# Patient Record
Sex: Female | Born: 1965
Health system: Southern US, Community
[De-identification: ages and names within clinical notes are randomized; demographics above are authoritative.]

## PROBLEM LIST (undated history)

## (undated) DIAGNOSIS — S62102D Fracture of unspecified carpal bone, left wrist, subsequent encounter for fracture with routine healing: Secondary | ICD-10-CM

## (undated) DIAGNOSIS — B009 Herpesviral infection, unspecified: Secondary | ICD-10-CM

## (undated) DIAGNOSIS — E559 Vitamin D deficiency, unspecified: Secondary | ICD-10-CM

## (undated) DIAGNOSIS — F32A Depression, unspecified: Secondary | ICD-10-CM

## (undated) DIAGNOSIS — R112 Nausea with vomiting, unspecified: Secondary | ICD-10-CM

## (undated) DIAGNOSIS — F329 Major depressive disorder, single episode, unspecified: Secondary | ICD-10-CM

## (undated) DIAGNOSIS — Z9889 Other specified postprocedural states: Secondary | ICD-10-CM

## (undated) HISTORY — DX: Herpesviral infection, unspecified: B00.9

## (undated) HISTORY — PX: BREAST BIOPSY: SHX20

## (undated) HISTORY — DX: Major depressive disorder, single episode, unspecified: F32.9

## (undated) HISTORY — DX: Fracture of unspecified carpal bone, left wrist, subsequent encounter for fracture with routine healing: S62.102D

## (undated) HISTORY — DX: Vitamin D deficiency, unspecified: E55.9

## (undated) HISTORY — PX: NOVASURE ABLATION: SHX5394

## (undated) HISTORY — DX: Depression, unspecified: F32.A

## (undated) HISTORY — PX: TONSILLECTOMY AND ADENOIDECTOMY: SHX28

---

## 2008-02-08 ENCOUNTER — Other Ambulatory Visit: Admission: RE | Admit: 2008-02-08 | Discharge: 2008-02-08 | Payer: Self-pay | Admitting: Family Medicine

## 2008-02-09 ENCOUNTER — Ambulatory Visit (HOSPITAL_COMMUNITY): Admission: RE | Admit: 2008-02-09 | Discharge: 2008-02-09 | Payer: Self-pay | Admitting: Internal Medicine

## 2009-02-28 ENCOUNTER — Other Ambulatory Visit: Admission: RE | Admit: 2009-02-28 | Discharge: 2009-02-28 | Payer: Self-pay | Admitting: Internal Medicine

## 2010-05-16 ENCOUNTER — Other Ambulatory Visit: Admission: RE | Admit: 2010-05-16 | Discharge: 2010-05-16 | Payer: Self-pay | Admitting: Internal Medicine

## 2010-06-04 ENCOUNTER — Ambulatory Visit (HOSPITAL_COMMUNITY): Admission: RE | Admit: 2010-06-04 | Discharge: 2010-06-04 | Payer: Self-pay | Admitting: Internal Medicine

## 2011-05-04 ENCOUNTER — Other Ambulatory Visit (HOSPITAL_COMMUNITY): Payer: Self-pay | Admitting: Internal Medicine

## 2011-05-04 DIAGNOSIS — Z1231 Encounter for screening mammogram for malignant neoplasm of breast: Secondary | ICD-10-CM

## 2011-06-08 ENCOUNTER — Ambulatory Visit (HOSPITAL_COMMUNITY)
Admission: RE | Admit: 2011-06-08 | Discharge: 2011-06-08 | Disposition: A | Discharge status: Home / Self Care | Payer: 59 | Source: Ambulatory Visit | Attending: Internal Medicine | Admitting: Internal Medicine

## 2011-06-08 DIAGNOSIS — Z1231 Encounter for screening mammogram for malignant neoplasm of breast: Secondary | ICD-10-CM

## 2012-05-19 ENCOUNTER — Other Ambulatory Visit (HOSPITAL_COMMUNITY): Payer: Self-pay | Admitting: Internal Medicine

## 2012-05-19 DIAGNOSIS — Z1231 Encounter for screening mammogram for malignant neoplasm of breast: Secondary | ICD-10-CM

## 2012-06-10 ENCOUNTER — Ambulatory Visit (HOSPITAL_COMMUNITY)
Admission: RE | Admit: 2012-06-10 | Discharge: 2012-06-10 | Disposition: A | Discharge status: Home / Self Care | Payer: PRIVATE HEALTH INSURANCE | Source: Ambulatory Visit | Attending: Internal Medicine | Admitting: Internal Medicine

## 2012-06-10 DIAGNOSIS — Z1231 Encounter for screening mammogram for malignant neoplasm of breast: Secondary | ICD-10-CM | POA: Insufficient documentation

## 2012-12-28 HISTORY — PX: ABDOMINAL HYSTERECTOMY: SHX81

## 2012-12-28 HISTORY — PX: TOTAL ABDOMINAL HYSTERECTOMY: SHX209

## 2013-05-08 ENCOUNTER — Other Ambulatory Visit (HOSPITAL_COMMUNITY): Payer: Self-pay | Admitting: Internal Medicine

## 2013-05-08 DIAGNOSIS — Z1231 Encounter for screening mammogram for malignant neoplasm of breast: Secondary | ICD-10-CM

## 2013-05-24 ENCOUNTER — Other Ambulatory Visit (HOSPITAL_COMMUNITY): Payer: Self-pay | Admitting: Internal Medicine

## 2013-05-24 ENCOUNTER — Other Ambulatory Visit (HOSPITAL_COMMUNITY)
Admission: RE | Admit: 2013-05-24 | Discharge: 2013-05-24 | Disposition: A | Discharge status: Home / Self Care | Payer: PRIVATE HEALTH INSURANCE | Source: Ambulatory Visit | Attending: Internal Medicine | Admitting: Internal Medicine

## 2013-05-24 DIAGNOSIS — R52 Pain, unspecified: Secondary | ICD-10-CM

## 2013-05-24 DIAGNOSIS — Z01419 Encounter for gynecological examination (general) (routine) without abnormal findings: Secondary | ICD-10-CM | POA: Insufficient documentation

## 2013-06-06 ENCOUNTER — Ambulatory Visit (HOSPITAL_COMMUNITY)
Admission: RE | Admit: 2013-06-06 | Discharge: 2013-06-06 | Disposition: A | Discharge status: Home / Self Care | Payer: PRIVATE HEALTH INSURANCE | Source: Ambulatory Visit | Attending: Internal Medicine | Admitting: Internal Medicine

## 2013-06-06 ENCOUNTER — Other Ambulatory Visit (HOSPITAL_COMMUNITY): Payer: Self-pay | Admitting: Internal Medicine

## 2013-06-06 DIAGNOSIS — N852 Hypertrophy of uterus: Secondary | ICD-10-CM | POA: Insufficient documentation

## 2013-06-06 DIAGNOSIS — D259 Leiomyoma of uterus, unspecified: Secondary | ICD-10-CM | POA: Insufficient documentation

## 2013-06-06 DIAGNOSIS — R52 Pain, unspecified: Secondary | ICD-10-CM

## 2013-06-06 DIAGNOSIS — N949 Unspecified condition associated with female genital organs and menstrual cycle: Secondary | ICD-10-CM | POA: Insufficient documentation

## 2013-06-12 ENCOUNTER — Ambulatory Visit (HOSPITAL_COMMUNITY)
Admission: RE | Admit: 2013-06-12 | Discharge: 2013-06-12 | Disposition: A | Discharge status: Home / Self Care | Payer: PRIVATE HEALTH INSURANCE | Source: Ambulatory Visit | Attending: Internal Medicine | Admitting: Internal Medicine

## 2013-06-12 ENCOUNTER — Ambulatory Visit (HOSPITAL_COMMUNITY): Payer: 59

## 2013-06-12 DIAGNOSIS — Z1231 Encounter for screening mammogram for malignant neoplasm of breast: Secondary | ICD-10-CM

## 2013-07-19 ENCOUNTER — Other Ambulatory Visit: Payer: Self-pay | Admitting: Obstetrics & Gynecology

## 2013-11-08 ENCOUNTER — Other Ambulatory Visit: Payer: Self-pay | Admitting: *Deleted

## 2014-06-15 ENCOUNTER — Other Ambulatory Visit: Payer: Self-pay | Admitting: Physician Assistant

## 2014-06-15 ENCOUNTER — Encounter: Payer: Self-pay | Admitting: Physician Assistant

## 2014-06-15 ENCOUNTER — Ambulatory Visit (INDEPENDENT_AMBULATORY_CARE_PROVIDER_SITE_OTHER): Payer: PRIVATE HEALTH INSURANCE | Admitting: Physician Assistant

## 2014-06-15 VITALS — BP 108/74 | HR 64 | Temp 98.6°F | Resp 16 | Ht 64.0 in | Wt 144.4 lb

## 2014-06-15 DIAGNOSIS — Z0000 Encounter for general adult medical examination without abnormal findings: Secondary | ICD-10-CM

## 2014-06-15 DIAGNOSIS — Z1159 Encounter for screening for other viral diseases: Secondary | ICD-10-CM

## 2014-06-15 DIAGNOSIS — E559 Vitamin D deficiency, unspecified: Secondary | ICD-10-CM

## 2014-06-15 DIAGNOSIS — Z79899 Other long term (current) drug therapy: Secondary | ICD-10-CM

## 2014-06-15 DIAGNOSIS — Z118 Encounter for screening for other infectious and parasitic diseases: Secondary | ICD-10-CM

## 2014-06-15 DIAGNOSIS — Z113 Encounter for screening for infections with a predominantly sexual mode of transmission: Secondary | ICD-10-CM

## 2014-06-15 LAB — CBC WITH DIFFERENTIAL/PLATELET
BASOS ABS: 0.1 10*3/uL (ref 0.0–0.1)
Basophils Relative: 1 % (ref 0–1)
EOS ABS: 0.1 10*3/uL (ref 0.0–0.7)
Eosinophils Relative: 1 % (ref 0–5)
HEMATOCRIT: 43.5 % (ref 36.0–46.0)
Hemoglobin: 14.5 g/dL (ref 12.0–15.0)
LYMPHS ABS: 1.7 10*3/uL (ref 0.7–4.0)
LYMPHS PCT: 27 % (ref 12–46)
MCH: 29.6 pg (ref 26.0–34.0)
MCHC: 33.3 g/dL (ref 30.0–36.0)
MCV: 88.8 fL (ref 78.0–100.0)
MONOS PCT: 7 % (ref 3–12)
Monocytes Absolute: 0.4 10*3/uL (ref 0.1–1.0)
NEUTROS PCT: 64 % (ref 43–77)
Neutro Abs: 4 10*3/uL (ref 1.7–7.7)
Platelets: 290 10*3/uL (ref 150–400)
RBC: 4.9 MIL/uL (ref 3.87–5.11)
RDW: 13.7 % (ref 11.5–15.5)
WBC: 6.2 10*3/uL (ref 4.0–10.5)

## 2014-06-15 LAB — URINALYSIS, ROUTINE W REFLEX MICROSCOPIC
Bilirubin Urine: NEGATIVE
GLUCOSE, UA: NEGATIVE mg/dL
HGB URINE DIPSTICK: NEGATIVE
Ketones, ur: NEGATIVE mg/dL
Leukocytes, UA: NEGATIVE
NITRITE: NEGATIVE
PH: 7 (ref 5.0–8.0)
Protein, ur: NEGATIVE mg/dL
Specific Gravity, Urine: 1.017 (ref 1.005–1.030)
UROBILINOGEN UA: 0.2 mg/dL (ref 0.0–1.0)

## 2014-06-15 LAB — HEMOGLOBIN A1C
HEMOGLOBIN A1C: 5.1 % (ref <5.7)
Mean Plasma Glucose: 100 mg/dL (ref <117)

## 2014-06-15 NOTE — Progress Notes (Signed)
Complete Physical  Assessment and Plan: HSV-1 (herpes simplex virus 1) infection-remission  Depression-remission  Vitamin D deficiency-check level  STD screening per patient w/o signs of infection Suggest getting 3D MGM due to density of breast On estrogen therapy, low risk DVT but add bASA Counseled on tanning and sunscreen use Health Maintenance  Discussed med's effects and SE's. Screening labs and tests as requested with regular follow-up as recommended.  HPI 48 y.o. female  presents for a complete physical. Her blood pressure has been controlled at home, today their BP is BP: 108/74 mmHg She does workout. She denies chest pain, shortness of breath, dizziness.  She is not on cholesterol medication and denies myalgias. Her cholesterol is at goal. The cholesterol last visit was:  LDL 60 Last A1C in the office was: 5.2  Patient is on Vitamin D supplement, 65. Patient has dense breast, suggest 3D MGM. On estrogen, suggest getting on low dose aspirin at least 3 days a week.  Has custody of grand daughter, will be in kindergarten this year.  Husband has been drinking and partying so he was kicked out of the house for past 4 months.  She given blood regularly every 8-12 weeks.   Current Medications:       Esterified Estrogens 1.25 MG Tabs  Take 1.25 mg by mouth daily.       Health Maintenance:   Immunization History  Administered Date(s) Administered  . Influenza-Unspecified 10/18/2013  . Tdap 02/08/2008   Tetanus: 2009 Pneumovax: N/A Flu vaccine: 2014 Zostavax: N/A Pap: 04/2013  TAH 06/2013  MGM: 05/2013 DEXA: N/A Colonoscopy: N/A EGD: N/A DEE: glasses, yearly 02/2014  Allergies:  Allergies  Allergen Reactions  . Depo-Provera [Medroxyprogesterone]    Medical History:  Past Medical History  Diagnosis Date  . HSV-1 (herpes simplex virus 1) infection   . Depression   . Vitamin D deficiency    Surgical History:  Past Surgical History  Procedure Laterality  Date  . Tonsillectomy and adenoidectomy    . Novasure ablation    . Abdominal hysterectomy      Jori Moll Neal,MD. 7/14   Family History:  Family History  Problem Relation Age of Onset  . Leukemia Maternal Aunt   . Cancer Maternal Grandfather     breast   Social History:  History  Substance Use Topics  . Smoking status: Never Smoker   . Smokeless tobacco: Not on file  . Alcohol Use: No     Comment: occasional    Review of Systems: [X]  = complains of  [ ]  = denies  General: Fatigue [ ]  Fever [ ]  Chills [ ]  Weakness [ ]   Insomnia [ ] Weight change [ ]  Night sweats [ ]   Change in appetite [ ]  Eyes: Redness [ ]  Blurred vision [ ]  Diplopia [ ]  Discharge [ ]   ENT: Congestion [ ]  Sinus Pain [ ]  Post Nasal Drip [ ]  Sore Throat [ ]  Earache [ ]  hearing loss [ ]  Tinnitus [ ]  Snoring [ ]   Cardiac: Chest pain/pressure [ ]  SOB [ ]  Orthopnea [ ]   Palpitations [ ]   Paroxysmal nocturnal dyspnea[ ]  Claudication [ ]  Edema [ ]   Pulmonary: Cough [ ]  Wheezing[ ]   SOB [ ]   Pleurisy [ ]   GI: Nausea [ ]  Vomiting[ ]  Dysphagia[ ]  Heartburn[ ]  Abdominal pain [ ]  Constipation [ ] ; Diarrhea [ ]  BRBPR [ ]  Melena[ ]  Bloating [ ]  Hemorrhoids [ ]   GU: Hematuria[ ]  Dysuria [ ]  Nocturia[ ]  Urgency [ ]   Hesitancy [ ]  Discharge [ ]  Frequency [ ]   Breast:  Breast lumps [ ]   nipple discharge [ ]    Neuro: Headaches[ ]  Vertigo[ ]  Paresthesias[ ]  Spasm [ ]  Speech changes [ ]  Incoordination [ ]   Ortho: Arthritis [ ]  Joint pain [ ]  Muscle pain [ ]  Joint swelling [ ]  Back Pain [ ]  Skin:  Rash [ ]   Pruritis [ ]  Change in skin lesion [ ]   Psych: Depression[ ]  Anxiety[ ]  Confusion [ ]  Memory loss [ ]   Heme/Lypmh: Bleeding [ ]  Bruising [ ]  Enlarged lymph nodes [ ]   Endocrine: Visual blurring [ ]  Paresthesia [ ]  Polyuria [ ]  Polydypsea [ ]    Heat/cold intolerance [ ]  Hypoglycemia [ ]   Physical Exam: Estimated body mass index is 24.77 kg/(m^2) as calculated from the following:   Height as of this encounter: 5\' 4"  (1.626 m).    Weight as of this encounter: 144 lb 6.4 oz (65.499 kg). BP 108/74  Pulse 64  Temp(Src) 98.6 F (37 C)  Resp 16  Ht 5\' 4"  (1.626 m)  Wt 144 lb 6.4 oz (65.499 kg)  BMI 24.77 kg/m2  LMP 05/31/2013 General Appearance: Well nourished, in no apparent distress. Eyes: PERRLA, EOMs, conjunctiva no swelling or erythema, normal fundi and vessels. Sinuses: No Frontal/maxillary tenderness ENT/Mouth: Ext aud canals clear, normal light reflex with TMs without erythema, bulging.  Good dentition. No erythema, swelling, or exudate on post pharynx. Tonsils not swollen or erythematous. Hearing normal.  Neck: Supple, thyroid normal. No bruits Respiratory: Respiratory effort normal, BS equal bilaterally without rales, rhonchi, wheezing or stridor. Cardio: RRR without murmurs, rubs or gallops. Brisk peripheral pulses without edema.  Chest: symmetric, with normal excursions and percussion. Breasts: defer Abdomen: Soft, +BS. Non tender, no guarding, rebound, hernias, masses, or organomegaly. .  Lymphatics: Non tender without lymphadenopathy.  Genitourinary: defer Musculoskeletal: Full ROM all peripheral extremities,5/5 strength, and normal gait. Skin: Warm, dry without rashes, lesions, ecchymosis.  Neuro: Cranial nerves intact, reflexes equal bilaterally. Normal muscle tone, no cerebellar symptoms. Sensation intact.  Psych: Awake and oriented X 3, normal affect, Insight and Judgment appropriate.   EKG: defer next year   Vicie Mutters 9:32 AM

## 2014-06-15 NOTE — Patient Instructions (Addendum)
Add low dose aspirin 3 days a week.  Patient has dense breast, suggest 3D MGM.  Preventative Care for Adults - Female      MAINTAIN REGULAR HEALTH EXAMS:  A routine yearly physical is a good way to check in with your primary care provider about your health and preventive screening. It is also an opportunity to share updates about your health and any concerns you have, and receive a thorough all-over exam.   Most health insurance companies pay for at least some preventative services.  Check with your health plan for specific coverages.  WHAT PREVENTATIVE SERVICES DO WOMEN NEED?  Adult women should have their weight and blood pressure checked regularly.   Women age 75 and older should have their cholesterol levels checked regularly.  Women should be screened for cervical cancer with a Pap smear and pelvic exam beginning at either age 5, or 3 years after they become sexually activity.    Breast cancer screening generally begins at age 42 with a mammogram and breast exam by your primary care provider.    Beginning at age 28 and continuing to age 33, women should be screened for colorectal cancer.  Certain people may need continued testing until age 95.  Updating vaccinations is part of preventative care.  Vaccinations help protect against diseases such as the flu.  Osteoporosis is a disease in which the bones lose minerals and strength as we age. Women ages 64 and over should discuss this with their caregivers, as should women after menopause who have other risk factors.  Lab tests are generally done as part of preventative care to screen for anemia and blood disorders, to screen for problems with the kidneys and liver, to screen for bladder problems, to check blood sugar, and to check your cholesterol level.  Preventative services generally include counseling about diet, exercise, avoiding tobacco, drugs, excessive alcohol consumption, and sexually transmitted infections.    GENERAL  RECOMMENDATIONS FOR GOOD HEALTH:  Healthy diet:  Eat a variety of foods, including fruit, vegetables, animal or vegetable protein, such as meat, fish, chicken, and eggs, or beans, lentils, tofu, and grains, such as rice.  Drink plenty of water daily.  Decrease saturated fat in the diet, avoid lots of red meat, processed foods, sweets, fast foods, and fried foods.  Exercise:  Aerobic exercise helps maintain good heart health. At least 30-40 minutes of moderate-intensity exercise is recommended. For example, a brisk walk that increases your heart rate and breathing. This should be done on most days of the week.   Find a type of exercise or a variety of exercises that you enjoy so that it becomes a part of your daily life.  Examples are running, walking, swimming, water aerobics, and biking.  For motivation and support, explore group exercise such as aerobic class, spin class, Zumba, Yoga,or  martial arts, etc.    Set exercise goals for yourself, such as a certain weight goal, walk or run in a race such as a 5k walk/run.  Speak to your primary care provider about exercise goals.  Disease prevention:  If you smoke or chew tobacco, find out from your caregiver how to quit. It can literally save your life, no matter how long you have been a tobacco user. If you do not use tobacco, never begin.   Maintain a healthy diet and normal weight. Increased weight leads to problems with blood pressure and diabetes.   The Body Mass Index or BMI is a way of measuring how  much of your body is fat. Having a BMI above 27 increases the risk of heart disease, diabetes, hypertension, stroke and other problems related to obesity. Your caregiver can help determine your BMI and based on it develop an exercise and dietary program to help you achieve or maintain this important measurement at a healthful level.  High blood pressure causes heart and blood vessel problems.  Persistent high blood pressure should be treated  with medicine if weight loss and exercise do not work.   Fat and cholesterol leaves deposits in your arteries that can block them. This causes heart disease and vessel disease elsewhere in your body.  If your cholesterol is found to be high, or if you have heart disease or certain other medical conditions, then you may need to have your cholesterol monitored frequently and be treated with medication.   Ask if you should have a cardiac stress test if your history suggests this. A stress test is a test done on a treadmill that looks for heart disease. This test can find disease prior to there being a problem.  Menopause can be associated with physical symptoms and risks. Hormone replacement therapy is available to decrease these. You should talk to your caregiver about whether starting or continuing to take hormones is right for you.   Osteoporosis is a disease in which the bones lose minerals and strength as we age. This can result in serious bone fractures. Risk of osteoporosis can be identified using a bone density scan. Women ages 72 and over should discuss this with their caregivers, as should women after menopause who have other risk factors. Ask your caregiver whether you should be taking a calcium supplement and Vitamin D, to reduce the rate of osteoporosis.   Avoid drinking alcohol in excess (more than two drinks per day).  Avoid use of street drugs. Do not share needles with anyone. Ask for professional help if you need assistance or instructions on stopping the use of alcohol, cigarettes, and/or drugs.  Brush your teeth twice a day with fluoride toothpaste, and floss once a day. Good oral hygiene prevents tooth decay and gum disease. The problems can be painful, unattractive, and can cause other health problems. Visit your dentist for a routine oral and dental check up and preventive care every 6-12 months.   Look at your skin regularly.  Use a mirror to look at your back. Notify your  caregivers of changes in moles, especially if there are changes in shapes, colors, a size larger than a pencil eraser, an irregular border, or development of new moles.  Safety:  Use seatbelts 100% of the time, whether driving or as a passenger.  Use safety devices such as hearing protection if you work in environments with loud noise or significant background noise.  Use safety glasses when doing any work that could send debris in to the eyes.  Use a helmet if you ride a bike or motorcycle.  Use appropriate safety gear for contact sports.  Talk to your caregiver about gun safety.  Use sunscreen with a SPF (or skin protection factor) of 15 or greater.  Lighter skinned people are at a greater risk of skin cancer. Don't forget to also wear sunglasses in order to protect your eyes from too much damaging sunlight. Damaging sunlight can accelerate cataract formation.   Practice safe sex. Use condoms. Condoms are used for birth control and to help reduce the spread of sexually transmitted infections (or STIs).  Some of the STIs  are gonorrhea (the clap), chlamydia, syphilis, trichomonas, herpes, HPV (human papilloma virus) and HIV (human immunodeficiency virus) which causes AIDS. The herpes, HIV and HPV are viral illnesses that have no cure. These can result in disability, cancer and death.   Keep carbon monoxide and smoke detectors in your home functioning at all times. Change the batteries every 6 months or use a model that plugs into the wall.   Vaccinations:  Stay up to date with your tetanus shots and other required immunizations. You should have a booster for tetanus every 10 years. Be sure to get your flu shot every year, since 5%-20% of the [place] population comes down with the flu. The flu vaccine changes each year, so being vaccinated once is not enough. Get your shot in the fall, before the flu season peaks.   Other vaccines to consider:  Human Papilloma Virus or HPV causes cancer of the cervix,  and other infections that can be transmitted from person to person. There is a vaccine for HPV, and females should get immunized between the ages of 32 and 53. It requires a series of 3 shots.   Pneumococcal vaccine to protect against certain types of pneumonia.  This is normally recommended for adults age 66 or older.  However, adults younger than 47 years old with certain underlying conditions such as diabetes, heart or lung disease should also receive the vaccine.  Shingles vaccine to protect against Varicella Zoster if you are older than age 70, or younger than 47 years old with certain underlying illness.  Hepatitis A vaccine to protect against a form of infection of the liver by a virus acquired from food.  Hepatitis B vaccine to protect against a form of infection of the liver by a virus acquired from blood or body fluids, particularly if you work in health care.  If you plan to travel internationally, check with your local health department for specific vaccination recommendations.  Cancer Screening:  Breast cancer screening is essential to preventive care for women. All women age 65 and older should perform a breast self-exam every month. At age 41 and older, women should have their caregiver complete a breast exam each year. Women at ages 75 and older should have a mammogram (x-ray film) of the breasts. Your caregiver can discuss how often you need mammograms.    Cervical cancer screening includes taking a Pap smear (sample of cells examined under a microscope) from the cervix (end of the uterus). It also includes testing for HPV (Human Papilloma Virus, which can cause cervical cancer). Screening and a pelvic exam should begin at age 28, or 3 years after a woman becomes sexually active. Screening should occur every year, with a Pap smear but no HPV testing, up to age 13. After age 63, you should have a Pap smear every 3 years with HPV testing, if no HPV was found previously.   Most  routine colon cancer screening begins at the age of 34. On a yearly basis, doctors may provide special easy to use take-home tests to check for hidden blood in the stool. Sigmoidoscopy or colonoscopy can detect the earliest forms of colon cancer and is life saving. These tests use a small camera at the end of a tube to directly examine the colon. Speak to your caregiver about this at age 25, when routine screening begins (and is repeated every 5 years unless early forms of pre-cancerous polyps or small growths are found).

## 2014-06-16 LAB — BASIC METABOLIC PANEL WITH GFR
BUN: 17 mg/dL (ref 6–23)
CALCIUM: 9.9 mg/dL (ref 8.4–10.5)
CHLORIDE: 103 meq/L (ref 96–112)
CO2: 28 mEq/L (ref 19–32)
Creat: 0.92 mg/dL (ref 0.50–1.10)
GFR, Est African American: 86 mL/min
GFR, Est Non African American: 75 mL/min
GLUCOSE: 79 mg/dL (ref 70–99)
POTASSIUM: 4.6 meq/L (ref 3.5–5.3)
SODIUM: 139 meq/L (ref 135–145)

## 2014-06-16 LAB — IRON AND TIBC
%SAT: 52 % (ref 20–55)
IRON: 202 ug/dL — AB (ref 42–145)
TIBC: 389 ug/dL (ref 250–470)
UIBC: 187 ug/dL (ref 125–400)

## 2014-06-16 LAB — HIV ANTIBODY (ROUTINE TESTING W REFLEX): HIV: NONREACTIVE

## 2014-06-16 LAB — HEPATIC FUNCTION PANEL
ALBUMIN: 4.3 g/dL (ref 3.5–5.2)
ALK PHOS: 48 U/L (ref 39–117)
ALT: 11 U/L (ref 0–35)
AST: 14 U/L (ref 0–37)
BILIRUBIN INDIRECT: 0.5 mg/dL (ref 0.2–1.2)
BILIRUBIN TOTAL: 0.7 mg/dL (ref 0.2–1.2)
Bilirubin, Direct: 0.2 mg/dL (ref 0.0–0.3)
Total Protein: 6.9 g/dL (ref 6.0–8.3)

## 2014-06-16 LAB — GC/CHLAMYDIA PROBE AMP, URINE
Chlamydia, Swab/Urine, PCR: NEGATIVE
GC Probe Amp, Urine: NEGATIVE

## 2014-06-16 LAB — MICROALBUMIN / CREATININE URINE RATIO
Creatinine, Urine: 139.1 mg/dL
Microalb Creat Ratio: 3.6 mg/g (ref 0.0–30.0)
Microalb, Ur: 0.5 mg/dL (ref 0.00–1.89)

## 2014-06-16 LAB — LIPID PANEL
CHOLESTEROL: 127 mg/dL (ref 0–200)
HDL: 49 mg/dL (ref >39)
LDL Cholesterol: 68 mg/dL (ref 0–99)
TRIGLYCERIDES: 52 mg/dL (ref <150)
Total CHOL/HDL Ratio: 2.6 Ratio
VLDL: 10 mg/dL (ref 0–40)

## 2014-06-16 LAB — TSH: TSH: 0.897 u[IU]/mL (ref 0.350–4.500)

## 2014-06-16 LAB — INSULIN, FASTING: Insulin fasting, serum: 7 u[IU]/mL (ref 3–28)

## 2014-06-16 LAB — VITAMIN D 25 HYDROXY (VIT D DEFICIENCY, FRACTURES): Vit D, 25-Hydroxy: 92 ng/mL — ABNORMAL HIGH (ref 30–89)

## 2014-06-16 LAB — MAGNESIUM: MAGNESIUM: 2 mg/dL (ref 1.5–2.5)

## 2014-06-16 LAB — RPR

## 2014-06-16 LAB — VITAMIN B12: Vitamin B-12: 641 pg/mL (ref 211–911)

## 2014-06-18 LAB — FERRITIN: Ferritin: 13 ng/mL (ref 10–291)

## 2014-06-19 LAB — HSV(HERPES SIMPLEX VRS) I + II AB-IGG
HSV 1 GLYCOPROTEIN G AB, IGG: 8.68 IV — AB
HSV 2 GLYCOPROTEIN G AB, IGG: 4.11 IV — AB

## 2014-07-04 ENCOUNTER — Other Ambulatory Visit: Payer: Self-pay | Admitting: Internal Medicine

## 2014-07-04 DIAGNOSIS — Z1231 Encounter for screening mammogram for malignant neoplasm of breast: Secondary | ICD-10-CM

## 2014-07-16 ENCOUNTER — Other Ambulatory Visit: Payer: Self-pay | Admitting: Internal Medicine

## 2014-07-16 ENCOUNTER — Ambulatory Visit (HOSPITAL_COMMUNITY)
Admission: RE | Admit: 2014-07-16 | Discharge: 2014-07-16 | Disposition: A | Discharge status: Home / Self Care | Payer: PRIVATE HEALTH INSURANCE | Source: Ambulatory Visit | Attending: Internal Medicine | Admitting: Internal Medicine

## 2014-07-16 DIAGNOSIS — Z1231 Encounter for screening mammogram for malignant neoplasm of breast: Secondary | ICD-10-CM

## 2014-09-20 ENCOUNTER — Other Ambulatory Visit: Payer: Self-pay | Admitting: Internal Medicine

## 2014-09-20 DIAGNOSIS — R928 Other abnormal and inconclusive findings on diagnostic imaging of breast: Secondary | ICD-10-CM

## 2014-09-26 ENCOUNTER — Ambulatory Visit
Admission: RE | Admit: 2014-09-26 | Discharge: 2014-09-26 | Disposition: A | Discharge status: Home / Self Care | Payer: PRIVATE HEALTH INSURANCE | Source: Ambulatory Visit | Attending: Internal Medicine | Admitting: Internal Medicine

## 2014-09-26 DIAGNOSIS — R928 Other abnormal and inconclusive findings on diagnostic imaging of breast: Secondary | ICD-10-CM

## 2015-06-19 ENCOUNTER — Encounter: Payer: Self-pay | Admitting: Physician Assistant

## 2015-06-19 ENCOUNTER — Ambulatory Visit (INDEPENDENT_AMBULATORY_CARE_PROVIDER_SITE_OTHER): Payer: 59 | Admitting: Physician Assistant

## 2015-06-19 VITALS — BP 110/78 | HR 52 | Temp 97.9°F | Resp 16 | Ht 64.0 in | Wt 143.0 lb

## 2015-06-19 DIAGNOSIS — Z79899 Other long term (current) drug therapy: Secondary | ICD-10-CM

## 2015-06-19 DIAGNOSIS — Z0000 Encounter for general adult medical examination without abnormal findings: Secondary | ICD-10-CM

## 2015-06-19 DIAGNOSIS — N182 Chronic kidney disease, stage 2 (mild): Secondary | ICD-10-CM

## 2015-06-19 DIAGNOSIS — E559 Vitamin D deficiency, unspecified: Secondary | ICD-10-CM

## 2015-06-19 DIAGNOSIS — E2839 Other primary ovarian failure: Secondary | ICD-10-CM

## 2015-06-19 DIAGNOSIS — F324 Major depressive disorder, single episode, in partial remission: Secondary | ICD-10-CM

## 2015-06-19 DIAGNOSIS — R922 Inconclusive mammogram: Secondary | ICD-10-CM

## 2015-06-19 DIAGNOSIS — F325 Major depressive disorder, single episode, in full remission: Secondary | ICD-10-CM

## 2015-06-19 LAB — CBC WITH DIFFERENTIAL/PLATELET
BASOS PCT: 1 % (ref 0–1)
Basophils Absolute: 0 10*3/uL (ref 0.0–0.1)
Eosinophils Absolute: 0.1 10*3/uL (ref 0.0–0.7)
Eosinophils Relative: 3 % (ref 0–5)
HEMATOCRIT: 38.3 % (ref 36.0–46.0)
HEMOGLOBIN: 12.7 g/dL (ref 12.0–15.0)
LYMPHS ABS: 1.8 10*3/uL (ref 0.7–4.0)
Lymphocytes Relative: 37 % (ref 12–46)
MCH: 29.7 pg (ref 26.0–34.0)
MCHC: 33.2 g/dL (ref 30.0–36.0)
MCV: 89.5 fL (ref 78.0–100.0)
MPV: 9.8 fL (ref 8.6–12.4)
Monocytes Absolute: 0.3 10*3/uL (ref 0.1–1.0)
Monocytes Relative: 6 % (ref 3–12)
NEUTROS PCT: 53 % (ref 43–77)
Neutro Abs: 2.6 10*3/uL (ref 1.7–7.7)
Platelets: 271 10*3/uL (ref 150–400)
RBC: 4.28 MIL/uL (ref 3.87–5.11)
RDW: 13.2 % (ref 11.5–15.5)
WBC: 4.9 10*3/uL (ref 4.0–10.5)

## 2015-06-19 LAB — BASIC METABOLIC PANEL WITH GFR
BUN: 15 mg/dL (ref 6–23)
CHLORIDE: 108 meq/L (ref 96–112)
CO2: 27 meq/L (ref 19–32)
CREATININE: 0.97 mg/dL (ref 0.50–1.10)
Calcium: 9.5 mg/dL (ref 8.4–10.5)
GFR, Est African American: 80 mL/min
GFR, Est Non African American: 70 mL/min
Glucose, Bld: 57 mg/dL — ABNORMAL LOW (ref 70–99)
Potassium: 4.2 mEq/L (ref 3.5–5.3)
Sodium: 143 mEq/L (ref 135–145)

## 2015-06-19 LAB — LIPID PANEL
CHOLESTEROL: 127 mg/dL (ref 0–200)
HDL: 51 mg/dL (ref >=46)
LDL CALC: 64 mg/dL (ref 0–99)
TRIGLYCERIDES: 60 mg/dL (ref <150)
Total CHOL/HDL Ratio: 2.5 Ratio
VLDL: 12 mg/dL (ref 0–40)

## 2015-06-19 LAB — HEPATIC FUNCTION PANEL
ALT: 12 U/L (ref 0–35)
AST: 15 U/L (ref 0–37)
Albumin: 4.2 g/dL (ref 3.5–5.2)
Alkaline Phosphatase: 59 U/L (ref 39–117)
BILIRUBIN INDIRECT: 0.3 mg/dL (ref 0.2–1.2)
Bilirubin, Direct: 0.1 mg/dL (ref 0.0–0.3)
TOTAL PROTEIN: 6.4 g/dL (ref 6.0–8.3)
Total Bilirubin: 0.4 mg/dL (ref 0.2–1.2)

## 2015-06-19 LAB — TSH: TSH: 0.955 u[IU]/mL (ref 0.350–4.500)

## 2015-06-19 LAB — MAGNESIUM: MAGNESIUM: 2.2 mg/dL (ref 1.5–2.5)

## 2015-06-19 MED ORDER — ESTERIFIED ESTROGENS 1.25 MG PO TABS: 1.2500 mg | ORAL_TABLET | Freq: Every day | ORAL | Status: DC

## 2015-06-19 NOTE — Patient Instructions (Addendum)
Add ENTERIC COATED low dose 81 mg Aspirin daily OR can do every other day if you have easy bruising to protect your heart and head. As well as to reduce risk of Colon Cancer by 20 %, Skin Cancer by 26 % , Melanoma by 46% and Pancreatic cancer by 60%  Preventive Care for Adults A healthy lifestyle and preventive care can promote health and wellness. Preventive health guidelines for women include the following key practices.  A routine yearly physical is a good way to check with your health care provider about your health and preventive screening. It is a chance to share any concerns and updates on your health and to receive a thorough exam.  Visit your dentist for a routine exam and preventive care every 6 months. Brush your teeth twice a day and floss once a day. Good oral hygiene prevents tooth decay and gum disease.  The frequency of eye exams is based on your age, health, family medical history, use of contact lenses, and other factors. Follow your health care provider's recommendations for frequency of eye exams.  Eat a healthy diet. Foods like vegetables, fruits, whole grains, low-fat dairy products, and lean protein foods contain the nutrients you need without too many calories. Decrease your intake of foods high in solid fats, added sugars, and salt. Eat the right amount of calories for you.Get information about a proper diet from your health care provider, if necessary.  Regular physical exercise is one of the most important things you can do for your health. Most adults should get at least 150 minutes of moderate-intensity exercise (any activity that increases your heart rate and causes you to sweat) each week. In addition, most adults need muscle-strengthening exercises on 2 or more days a week.  Maintain a healthy weight. The body mass index (BMI) is a screening tool to identify possible weight problems. It provides an estimate of body fat based on height and weight. Your health care  provider can find your BMI and can help you achieve or maintain a healthy weight.For adults 20 years and older:  A BMI below 18.5 is considered underweight.  A BMI of 18.5 to 24.9 is normal.  A BMI of 25 to 29.9 is considered overweight.  A BMI of 30 and above is considered obese.  Maintain normal blood lipids and cholesterol levels by exercising and minimizing your intake of saturated fat. Eat a balanced diet with plenty of fruit and vegetables. If your lipid or cholesterol levels are high, you are over 50, or you are at high risk for heart disease, you may need your cholesterol levels checked more frequently.Ongoing high lipid and cholesterol levels should be treated with medicines if diet and exercise are not working.  If you smoke, find out from your health care provider how to quit. If you do not use tobacco, do not start.  Lung cancer screening is recommended for adults aged 27-80 years who are at high risk for developing lung cancer because of a history of smoking. A yearly low-dose CT scan of the lungs is recommended for people who have at least a 30-pack-year history of smoking and are a current smoker or have quit within the past 15 years. A pack year of smoking is smoking an average of 1 pack of cigarettes a day for 1 year (for example: 1 pack a day for 30 years or 2 packs a day for 15 years). Yearly screening should continue until the smoker has stopped smoking for at least  15 years. Yearly screening should be stopped for people who develop a health problem that would prevent them from having lung cancer treatment.  Avoid use of street drugs. Do not share needles with anyone. Ask for help if you need support or instructions about stopping the use of drugs.  High blood pressure causes heart disease and increases the risk of stroke.  Ongoing high blood pressure should be treated with medicines if weight loss and exercise do not work.  If you are 33-9 years old, ask your health care  provider if you should take aspirin to prevent strokes.  Diabetes screening involves taking a blood sample to check your fasting blood sugar level. This should be done once every 3 years, after age 18, if you are within normal weight and without risk factors for diabetes. Testing should be considered at a younger age or be carried out more frequently if you are overweight and have at least 1 risk factor for diabetes.  Breast cancer screening is essential preventive care for women. You should practice "breast self-awareness." This means understanding the normal appearance and feel of your breasts and may include breast self-examination. Any changes detected, no matter how small, should be reported to a health care provider. Women in their 86s and 30s should have a clinical breast exam (CBE) by a health care provider as part of a regular health exam every 1 to 3 years. After age 60, women should have a CBE every year. Starting at age 34, women should consider having a mammogram (breast X-ray test) every year. Women who have a family history of breast cancer should talk to their health care provider about genetic screening. Women at a high risk of breast cancer should talk to their health care providers about having an MRI and a mammogram every year.  Breast cancer gene (BRCA)-related cancer risk assessment is recommended for women who have family members with BRCA-related cancers. BRCA-related cancers include breast, ovarian, tubal, and peritoneal cancers. Having family members with these cancers may be associated with an increased risk for harmful changes (mutations) in the breast cancer genes BRCA1 and BRCA2. Results of the assessment will determine the need for genetic counseling and BRCA1 and BRCA2 testing.  Routine pelvic exams to screen for cancer are no longer recommended for nonpregnant women who are considered low risk for cancer of the pelvic organs (ovaries, uterus, and vagina) and who do not have  symptoms. Ask your health care provider if a screening pelvic exam is right for you.  If you have had past treatment for cervical cancer or a condition that could lead to cancer, you need Pap tests and screening for cancer for at least 20 years after your treatment. If Pap tests have been discontinued, your risk factors (such as having a new sexual partner) need to be reassessed to determine if screening should be resumed. Some women have medical problems that increase the chance of getting cervical cancer. In these cases, your health care provider may recommend more frequent screening and Pap tests.    Colorectal cancer can be detected and often prevented. Most routine colorectal cancer screening begins at the age of 30 years and continues through age 59 years. However, your health care provider may recommend screening at an earlier age if you have risk factors for colon cancer. On a yearly basis, your health care provider may provide home test kits to check for hidden blood in the stool. Use of a small camera at the end of a  tube, to directly examine the colon (sigmoidoscopy or colonoscopy), can detect the earliest forms of colorectal cancer. Talk to your health care provider about this at age 39, when routine screening begins. Direct exam of the colon should be repeated every 5-10 years through age 56 years, unless early forms of pre-cancerous polyps or small growths are found.  Osteoporosis is a disease in which the bones lose minerals and strength with aging. This can result in serious bone fractures or breaks. The risk of osteoporosis can be identified using a bone density scan. Women ages 61 years and over and women at risk for fractures or osteoporosis should discuss screening with their health care providers. Ask your health care provider whether you should take a calcium supplement or vitamin D to reduce the rate of osteoporosis.  Menopause can be associated with physical symptoms and risks.  Hormone replacement therapy is available to decrease symptoms and risks. You should talk to your health care provider about whether hormone replacement therapy is right for you.  Use sunscreen. Apply sunscreen liberally and repeatedly throughout the day. You should seek shade when your shadow is shorter than you. Protect yourself by wearing long sleeves, pants, a wide-brimmed hat, and sunglasses year round, whenever you are outdoors.  Once a month, do a whole body skin exam, using a mirror to look at the skin on your back. Tell your health care provider of new moles, moles that have irregular borders, moles that are larger than a pencil eraser, or moles that have changed in shape or color.  Stay current with required vaccines (immunizations).  Influenza vaccine. All adults should be immunized every year.  Tetanus, diphtheria, and acellular pertussis (Td, Tdap) vaccine. Pregnant women should receive 1 dose of Tdap vaccine during each pregnancy. The dose should be obtained regardless of the length of time since the last dose. Immunization is preferred during the 27th-36th week of gestation. An adult who has not previously received Tdap or who does not know her vaccine status should receive 1 dose of Tdap. This initial dose should be followed by tetanus and diphtheria toxoids (Td) booster doses every 10 years. Adults with an unknown or incomplete history of completing a 3-dose immunization series with Td-containing vaccines should begin or complete a primary immunization series including a Tdap dose. Adults should receive a Td booster every 10 years.    Zoster vaccine. One dose is recommended for adults aged 80 years or older unless certain conditions are present.    Pneumococcal 13-valent conjugate (PCV13) vaccine. When indicated, a person who is uncertain of her immunization history and has no record of immunization should receive the PCV13 vaccine. An adult aged 51 years or older who has certain  medical conditions and has not been previously immunized should receive 1 dose of PCV13 vaccine. This PCV13 should be followed with a dose of pneumococcal polysaccharide (PPSV23) vaccine. The PPSV23 vaccine dose should be obtained at least 8 weeks after the dose of PCV13 vaccine. An adult aged 80 years or older who has certain medical conditions and previously received 1 or more doses of PPSV23 vaccine should receive 1 dose of PCV13. The PCV13 vaccine dose should be obtained 1 or more years after the last PPSV23 vaccine dose.    Pneumococcal polysaccharide (PPSV23) vaccine. When PCV13 is also indicated, PCV13 should be obtained first. All adults aged 33 years and older should be immunized. An adult younger than age 45 years who has certain medical conditions should be immunized. Any person  who resides in a nursing home or long-term care facility should be immunized. An adult smoker should be immunized. People with an immunocompromised condition and certain other conditions should receive both PCV13 and PPSV23 vaccines. People with human immunodeficiency virus (HIV) infection should be immunized as soon as possible after diagnosis. Immunization during chemotherapy or radiation therapy should be avoided. Routine use of PPSV23 vaccine is not recommended for American Indians, [place] Natives, or people younger than 65 years unless there are medical conditions that require PPSV23 vaccine. When indicated, people who have unknown immunization and have no record of immunization should receive PPSV23 vaccine. One-time revaccination 5 years after the first dose of PPSV23 is recommended for people aged 19-64 years who have chronic kidney failure, nephrotic syndrome, asplenia, or immunocompromised conditions. People who received 1-2 doses of PPSV23 before age 4 years should receive another dose of PPSV23 vaccine at age 50 years or later if at least 5 years have passed since the previous dose. Doses of PPSV23 are not needed  for people immunized with PPSV23 at or after age 18 years.   Preventive Services / Frequency  Ages 19 years and over  Blood pressure check.  Lipid and cholesterol check.  Lung cancer screening. / Every year if you are aged 109-80 years and have a 30-pack-year history of smoking and currently smoke or have quit within the past 15 years. Yearly screening is stopped once you have quit smoking for at least 15 years or develop a health problem that would prevent you from having lung cancer treatment.  Clinical breast exam.** / Every year after age 74 years.  BRCA-related cancer risk assessment.** / For women who have family members with a BRCA-related cancer (breast, ovarian, tubal, or peritoneal cancers).  Mammogram.** / Every year beginning at age 82 years and continuing for as long as you are in good health. Consult with your health care provider.  Pap test.** / Every 3 years starting at age 70 years through age 27 or 8 years with 3 consecutive normal Pap tests. Testing can be stopped between 65 and 70 years with 3 consecutive normal Pap tests and no abnormal Pap or HPV tests in the past 10 years.  Fecal occult blood test (FOBT) of stool. / Every year beginning at age 30 years and continuing until age 51 years. You may not need to do this test if you get a colonoscopy every 10 years.  Flexible sigmoidoscopy or colonoscopy.** / Every 5 years for a flexible sigmoidoscopy or every 10 years for a colonoscopy beginning at age 5 years and continuing until age 36 years.  Hepatitis C blood test.** / For all people born from [birth year] through 1964 and any individual with known risks for hepatitis C.  Osteoporosis screening.** / A one-time screening for women ages 48 years and over and women at risk for fractures or osteoporosis.  Skin self-exam. / Monthly.  Influenza vaccine. / Every year.  Tetanus, diphtheria, and acellular pertussis (Tdap/Td) vaccine.** / 1 dose of Td every 10 years.  Zoster  vaccine.** / 1 dose for adults aged 72 years or older.  Pneumococcal 13-valent conjugate (PCV13) vaccine.** / Consult your health care provider.  Pneumococcal polysaccharide (PPSV23) vaccine.** / 1 dose for all adults aged 31 years and older. Screening for abdominal aortic aneurysm (AAA)  by ultrasound is recommended for people who have history of high blood pressure or who are current or former smokers.

## 2015-06-19 NOTE — Progress Notes (Signed)
Complete Physical  Assessment and Plan: HSV-1 (herpes simplex virus 1) infection-remission  Depression-remission  Vitamin D deficiency-check level  Suggest getting 3D MGM due to density of breast On estrogen therapy, low risk DVT but add bASA Counseled on tanning and sunscreen use Health Maintenance  Discussed med's effects and SE's. Screening labs and tests as requested with regular follow-up as recommended.  HPI 49 y.o. female  presents for a complete physical. Her blood pressure has been controlled at home, today their BP is BP: 110/78 mmHg She does workout. She denies chest pain, shortness of breath, dizziness.  She is not on cholesterol medication and denies myalgias. Her cholesterol is at goal. The cholesterol last visit was:   Lab Results  Component Value Date   CHOL 127 06/15/2014   HDL 49 06/15/2014   LDLCALC 68 06/15/2014   TRIG 52 06/15/2014   CHOLHDL 2.6 06/15/2014   Last A1C in the office was:  Lab Results  Component Value Date   HGBA1C 5.1 06/15/2014   Patient is on Vitamin D supplement: Lab Results  Component Value Date   VD25OH 92* 06/15/2014   Patient has dense breast, had normal MGM.  On estrogen, suggest getting on low dose aspirin at least 3 days a week.  Has custody of granddaughter, Lolita Patella, will be in 1st grade this year.  Divorced this past year, states it was stressful but she is doing well with it.  She given blood regularly every 8-12 weeks.   Current Medications:    Medication List       This list is accurate as of: 06/19/15  8:51 AM.  Always use your most recent med list.               Esterified Estrogens 1.25 MG Tabs  Take 1.25 mg by mouth daily.       Health Maintenance:   Immunization History  Administered Date(s) Administered  . Influenza-Unspecified 10/18/2013, 10/17/2014  . Tdap 02/08/2008   Tetanus: 2009 Pneumovax: N/A Prevnar 13: N/A Flu vaccine: 2014 Zostavax: N/A Pap: 04/2013  TAH 06/2013  US Pelvis:  05/2013 MGM: 09/26/2014, repeat right breast DMGM that was normal DEXA: N/A Colonoscopy: N/A EGD: N/A DEE: Dr. Sabra Heck glasses, yearly 02/2014 Dentist: Dr. Derenda Mis in [place]  Routine labs RPR negative 05/2014 HIV negative 05/2014  Allergies:  Allergies  Allergen Reactions  . Depo-Provera [Medroxyprogesterone]    Medical History:  Past Medical History  Diagnosis Date  . HSV-1 (herpes simplex virus 1) infection   . Depression   . Vitamin D deficiency    Surgical History:  Past Surgical History  Procedure Laterality Date  . Tonsillectomy and adenoidectomy    . Novasure ablation    . Abdominal hysterectomy      Jori Moll Neal,MD. 7/14   Family History:  Family History  Problem Relation Age of Onset  . Leukemia Maternal Aunt   . Cancer Maternal Grandfather     breast   Social History:  History  Substance Use Topics  . Smoking status: Never Smoker   . Smokeless tobacco: Not on file  . Alcohol Use: No     Comment: occasional   Review of Systems  Constitutional: Negative.   HENT: Negative.   Eyes: Negative.   Respiratory: Negative.   Cardiovascular: Negative.   Gastrointestinal: Negative.   Genitourinary: Negative.   Musculoskeletal: Negative.   Skin: Negative.   Neurological: Negative.   Endo/Heme/Allergies: Negative.   Psychiatric/Behavioral: Negative.      Physical Exam: Estimated body  mass index is 24.53 kg/(m^2) as calculated from the following:   Height as of this encounter: 5\' 4"  (1.626 m).   Weight as of this encounter: 143 lb (64.864 kg). BP 110/78 mmHg  Pulse 52  Temp(Src) 97.9 F (36.6 C)  Resp 16  Ht 5\' 4"  (1.626 m)  Wt 143 lb (64.864 kg)  BMI 24.53 kg/m2  LMP 05/31/2013 General Appearance: Well nourished, in no apparent distress. Eyes: PERRLA, EOMs, conjunctiva no swelling or erythema, normal fundi and vessels. Sinuses: No Frontal/maxillary tenderness ENT/Mouth: Ext aud canals clear, normal light reflex with TMs without erythema,  bulging.  Good dentition. No erythema, swelling, or exudate on post pharynx. Tonsils not swollen or erythematous. Hearing normal.  Neck: Supple, thyroid normal. No bruits Respiratory: Respiratory effort normal, BS equal bilaterally without rales, rhonchi, wheezing or stridor. Cardio: RRR without murmurs, rubs or gallops. Brisk peripheral pulses without edema.  Chest: symmetric, with normal excursions and percussion. Breasts: defer Abdomen: Soft, +BS. Non tender, no guarding, rebound, hernias, masses, or organomegaly. .  Lymphatics: Non tender without lymphadenopathy.  Genitourinary: defer Musculoskeletal: Full ROM all peripheral extremities,5/5 strength, and normal gait. Skin: Tattoo on left lateral ankle. Warm, dry without rashes, lesions, ecchymosis.  Neuro: Cranial nerves intact, reflexes equal bilaterally. Normal muscle tone, no cerebellar symptoms. Sensation intact.  Psych: Awake and oriented X 3, normal affect, Insight and Judgment appropriate.   EKG: defer q 3 years   Vicie Mutters 8:47 AM

## 2015-06-20 LAB — VITAMIN D 25 HYDROXY (VIT D DEFICIENCY, FRACTURES): Vit D, 25-Hydroxy: 43 ng/mL (ref 30–100)

## 2015-06-20 NOTE — Addendum Note (Signed)
Addended by: Vicie Mutters R on: 06/20/2015 11:32 AM   Modules accepted: Orders, SmartSet

## 2015-06-25 ENCOUNTER — Other Ambulatory Visit: Payer: PRIVATE HEALTH INSURANCE

## 2015-06-26 ENCOUNTER — Encounter: Payer: Self-pay | Admitting: Internal Medicine

## 2015-07-04 ENCOUNTER — Other Ambulatory Visit: Payer: PRIVATE HEALTH INSURANCE

## 2015-07-11 ENCOUNTER — Ambulatory Visit
Admission: RE | Admit: 2015-07-11 | Discharge: 2015-07-11 | Disposition: A | Discharge status: Home / Self Care | Payer: 59 | Source: Ambulatory Visit | Attending: Physician Assistant | Admitting: Physician Assistant

## 2015-07-11 DIAGNOSIS — N182 Chronic kidney disease, stage 2 (mild): Secondary | ICD-10-CM

## 2016-04-06 ENCOUNTER — Other Ambulatory Visit: Payer: Self-pay

## 2016-04-06 DIAGNOSIS — Z1231 Encounter for screening mammogram for malignant neoplasm of breast: Secondary | ICD-10-CM

## 2016-04-27 ENCOUNTER — Ambulatory Visit
Admission: RE | Admit: 2016-04-27 | Discharge: 2016-04-27 | Disposition: A | Discharge status: Home / Self Care | Payer: 59 | Source: Ambulatory Visit

## 2016-04-27 DIAGNOSIS — Z1231 Encounter for screening mammogram for malignant neoplasm of breast: Secondary | ICD-10-CM

## 2016-04-28 ENCOUNTER — Other Ambulatory Visit: Payer: Self-pay | Admitting: Internal Medicine

## 2016-04-29 ENCOUNTER — Other Ambulatory Visit: Payer: Self-pay | Admitting: Internal Medicine

## 2016-04-29 DIAGNOSIS — R928 Other abnormal and inconclusive findings on diagnostic imaging of breast: Secondary | ICD-10-CM

## 2016-05-06 ENCOUNTER — Ambulatory Visit
Admission: RE | Admit: 2016-05-06 | Discharge: 2016-05-06 | Disposition: A | Discharge status: Home / Self Care | Payer: 59 | Source: Ambulatory Visit | Attending: Internal Medicine | Admitting: Internal Medicine

## 2016-05-06 ENCOUNTER — Other Ambulatory Visit: Payer: Self-pay | Admitting: Internal Medicine

## 2016-05-06 DIAGNOSIS — R928 Other abnormal and inconclusive findings on diagnostic imaging of breast: Secondary | ICD-10-CM

## 2016-06-18 ENCOUNTER — Ambulatory Visit (INDEPENDENT_AMBULATORY_CARE_PROVIDER_SITE_OTHER): Payer: 59 | Admitting: Physician Assistant

## 2016-06-18 ENCOUNTER — Other Ambulatory Visit: Payer: Self-pay | Admitting: Physician Assistant

## 2016-06-18 ENCOUNTER — Encounter: Payer: Self-pay | Admitting: Physician Assistant

## 2016-06-18 VITALS — BP 112/80 | HR 76 | Temp 97.5°F | Resp 16 | Ht 64.0 in | Wt 145.2 lb

## 2016-06-18 DIAGNOSIS — Z1389 Encounter for screening for other disorder: Secondary | ICD-10-CM

## 2016-06-18 DIAGNOSIS — Z13220 Encounter for screening for lipoid disorders: Secondary | ICD-10-CM

## 2016-06-18 DIAGNOSIS — E559 Vitamin D deficiency, unspecified: Secondary | ICD-10-CM

## 2016-06-18 DIAGNOSIS — R922 Inconclusive mammogram: Secondary | ICD-10-CM | POA: Insufficient documentation

## 2016-06-18 DIAGNOSIS — Z79899 Other long term (current) drug therapy: Secondary | ICD-10-CM | POA: Insufficient documentation

## 2016-06-18 DIAGNOSIS — B009 Herpesviral infection, unspecified: Secondary | ICD-10-CM

## 2016-06-18 DIAGNOSIS — F32A Depression, unspecified: Secondary | ICD-10-CM | POA: Insufficient documentation

## 2016-06-18 DIAGNOSIS — Z136 Encounter for screening for cardiovascular disorders: Secondary | ICD-10-CM | POA: Diagnosis not present

## 2016-06-18 DIAGNOSIS — R9230 Dense breasts, unspecified: Secondary | ICD-10-CM | POA: Insufficient documentation

## 2016-06-18 DIAGNOSIS — Z0000 Encounter for general adult medical examination without abnormal findings: Secondary | ICD-10-CM

## 2016-06-18 DIAGNOSIS — N3000 Acute cystitis without hematuria: Secondary | ICD-10-CM

## 2016-06-18 DIAGNOSIS — F329 Major depressive disorder, single episode, unspecified: Secondary | ICD-10-CM

## 2016-06-18 NOTE — Progress Notes (Signed)
Complete Physical  Assessment and Plan: Julie Cochran was seen today for annual exam.  Diagnoses and all orders for this visit:  HSV-1 (herpes simplex virus 1) infection  - continue supplement  Depression -     TSH - in remission  Vitamin D deficiency  - continue medication  Routine general medical examination at a health care facility -     CBC with Differential/Platelet -     BASIC METABOLIC PANEL WITH GFR -     Hepatic function panel -     TSH -     Lipid panel -     Urinalysis, Routine w reflex microscopic (not at [facility]) -     Microalbumin / creatinine urine ratio -     EKG 12-Lead  Medication management -     CBC with Differential/Platelet -     BASIC METABOLIC PANEL WITH GFR -     Hepatic function panel  Dense breast tissue  - get 3D MGM  Screening cholesterol level -     Lipid panel  Screening for blood or protein in urine -     Urinalysis, Routine w reflex microscopic (not at [facility]) -     Microalbumin / creatinine urine ratio  Discussed med's effects and SE's. Screening labs and tests as requested with regular follow-up as recommended.  HPI 50 y.o. female  presents for a complete physical. Her blood pressure has been controlled at home, today their BP is BP: 112/80 mmHg She does workout. She denies chest pain, shortness of breath, dizziness.  She is not on cholesterol medication and denies myalgias. Her cholesterol is at goal. The cholesterol last visit was:   Lab Results  Component Value Date   CHOL 127 06/19/2015   HDL 51 06/19/2015   LDLCALC 64 06/19/2015   TRIG 60 06/19/2015   CHOLHDL 2.5 06/19/2015   Last A1C in the office was:  Lab Results  Component Value Date   HGBA1C 5.1 06/15/2014   Patient is on Vitamin D supplement: Lab Results  Component Value Date   VD25OH 57 06/19/2015   She is off estrogen at this time and states she is doing well.  Has custody of granddaughter, Julie Cochran, will be in 2ng grade this year.  Divorced this past year, moved  in with boyfriend, has two sons 104 (has kid zoe same age as Julie Cochran)  and 66 son.   Current Medications:    Medication List       This list is accurate as of: 06/18/16  9:22 AM.  Always use your most recent med list.               BIOTIN PO  Take 1 tablet by mouth daily.     LYSINE PO  Take 1 tablet by mouth daily.     multivitamin tablet  Take 1 tablet by mouth daily.       Health Maintenance:   Immunization History  Administered Date(s) Administered  . Influenza-Unspecified 10/18/2013, 10/17/2014, 08/27/2015  . Tdap 02/08/2008   Tetanus: 2009 Pneumovax: N/A Prevnar 13: N/A Flu vaccine: 2016 Zostavax: N/A Pap: 04/2013  TAH 06/2013  US Pelvis: 05/2013 3DMGM: 04/27/2016 Screening, DMGM RT breast with normal BX DEXA: N/A Colonoscopy: N/A EGD: N/A DEE: Dr. Sabra Heck glasses, 02/2014 Dentist: Dr. Clearence Cheek in [place]  Routine labs RPR negative 05/2014 HIV negative 05/2014  Allergies:  Allergies  Allergen Reactions  . Depo-Provera [Medroxyprogesterone]    Medical History:  Past Medical History  Diagnosis Date  . HSV-1 (  herpes simplex virus 1) infection   . Depression   . Vitamin D deficiency    Surgical History:  Past Surgical History  Procedure Laterality Date  . Tonsillectomy and adenoidectomy    . Novasure ablation    . Abdominal hysterectomy      Julie Moll Neal,MD. 7/14   Family History:  Family History  Problem Relation Age of Onset  . Leukemia Maternal Aunt   . Cancer Maternal Grandfather     breast   Social History:  Social History  Substance Use Topics  . Smoking status: Never Smoker   . Smokeless tobacco: None  . Alcohol Use: No     Comment: occasional   Review of Systems  Constitutional: Negative.   HENT: Negative.   Eyes: Negative.   Respiratory: Negative.   Cardiovascular: Negative.   Gastrointestinal: Negative.   Genitourinary: Negative.   Musculoskeletal: Negative.   Skin: Negative.   Neurological: Negative.    Endo/Heme/Allergies: Negative.   Psychiatric/Behavioral: Negative.    Physical Exam: Estimated body mass index is 24.91 kg/(m^2) as calculated from the following:   Height as of this encounter: 5\' 4"  (1.626 m).   Weight as of this encounter: 145 lb 3.2 oz (65.862 kg). BP 112/80 mmHg  Pulse 76  Temp(Src) 97.5 F (36.4 C)  Resp 16  Ht 5\' 4"  (1.626 m)  Wt 145 lb 3.2 oz (65.862 kg)  BMI 24.91 kg/m2  LMP 05/31/2013 General Appearance: Well nourished, in no apparent distress. Eyes: PERRLA, EOMs, conjunctiva no swelling or erythema, normal fundi and vessels. Sinuses: No Frontal/maxillary tenderness ENT/Mouth: Ext aud canals clear, normal light reflex with TMs without erythema, bulging.  Good dentition. No erythema, swelling, or exudate on post pharynx. Tonsils not swollen or erythematous. Hearing normal.  Neck: Supple, thyroid normal. No bruits Respiratory: Respiratory effort normal, BS equal bilaterally without rales, rhonchi, wheezing or stridor. Cardio: RRR without murmurs, rubs or gallops. Brisk peripheral pulses without edema.  Chest: symmetric, with normal excursions and percussion. Breasts: defer Abdomen: Soft, +BS. Non tender, no guarding, rebound, hernias, masses, or organomegaly. .  Lymphatics: Non tender without lymphadenopathy.  Genitourinary: defer Musculoskeletal: Full ROM all peripheral extremities,5/5 strength, and normal gait. Skin: Tattoo on left lateral ankle. Warm, dry without rashes, lesions, ecchymosis.  Neuro: Cranial nerves intact, reflexes equal bilaterally. Normal muscle tone, no cerebellar symptoms. Sensation intact.  Psych: Awake and oriented X 3, normal affect, Insight and Judgment appropriate.   EKG:  WNL, no ST changes   Julie Cochran 9:15 AM

## 2016-06-19 ENCOUNTER — Other Ambulatory Visit: Payer: Self-pay | Admitting: Physician Assistant

## 2016-06-19 LAB — CBC WITH DIFFERENTIAL/PLATELET
Basophils Absolute: 62 cells/uL (ref 0–200)
Basophils Relative: 1 %
EOS ABS: 124 {cells}/uL (ref 15–500)
Eosinophils Relative: 2 %
HEMATOCRIT: 42 % (ref 35.0–45.0)
Hemoglobin: 13.6 g/dL (ref 11.7–15.5)
LYMPHS ABS: 1984 {cells}/uL (ref 850–3900)
LYMPHS PCT: 32 %
MCH: 28.6 pg (ref 27.0–33.0)
MCHC: 32.4 g/dL (ref 32.0–36.0)
MCV: 88.4 fL (ref 80.0–100.0)
MPV: 9.6 fL (ref 7.5–12.5)
Monocytes Absolute: 434 cells/uL (ref 200–950)
Monocytes Relative: 7 %
NEUTROS PCT: 58 %
Neutro Abs: 3596 cells/uL (ref 1500–7800)
Platelets: 269 10*3/uL (ref 140–400)
RBC: 4.75 MIL/uL (ref 3.80–5.10)
RDW: 13.8 % (ref 11.0–15.0)
WBC: 6.2 10*3/uL (ref 3.8–10.8)

## 2016-06-19 LAB — BASIC METABOLIC PANEL WITH GFR
BUN: 18 mg/dL (ref 7–25)
CO2: 28 mmol/L (ref 20–31)
Calcium: 9.8 mg/dL (ref 8.6–10.2)
Chloride: 105 mmol/L (ref 98–110)
Creat: 0.84 mg/dL (ref 0.50–1.10)
GFR, EST NON AFRICAN AMERICAN: 82 mL/min (ref >=60)
GFR, Est African American: >89 mL/min (ref >=60)
GLUCOSE: 75 mg/dL (ref 65–99)
POTASSIUM: 4.3 mmol/L (ref 3.5–5.3)
Sodium: 141 mmol/L (ref 135–146)

## 2016-06-19 LAB — URINALYSIS, ROUTINE W REFLEX MICROSCOPIC
Bilirubin Urine: NEGATIVE
Glucose, UA: NEGATIVE
Hgb urine dipstick: NEGATIVE
Ketones, ur: NEGATIVE
Nitrite: POSITIVE — AB
PROTEIN: NEGATIVE
Specific Gravity, Urine: 1.024 (ref 1.001–1.035)
pH: 5.5 (ref 5.0–8.0)

## 2016-06-19 LAB — HEPATIC FUNCTION PANEL
ALBUMIN: 4.4 g/dL (ref 3.6–5.1)
ALK PHOS: 68 U/L (ref 33–115)
ALT: 14 U/L (ref 6–29)
AST: 16 U/L (ref 10–35)
Bilirubin, Direct: 0.1 mg/dL (ref <=0.2)
Indirect Bilirubin: 0.5 mg/dL (ref 0.2–1.2)
TOTAL PROTEIN: 6.8 g/dL (ref 6.1–8.1)
Total Bilirubin: 0.6 mg/dL (ref 0.2–1.2)

## 2016-06-19 LAB — LIPID PANEL
Cholesterol: 157 mg/dL (ref 125–200)
HDL: 69 mg/dL (ref >=46)
LDL CALC: 73 mg/dL (ref <130)
Total CHOL/HDL Ratio: 2.3 Ratio (ref <=5.0)
Triglycerides: 77 mg/dL (ref <150)
VLDL: 15 mg/dL (ref <30)

## 2016-06-19 LAB — URINALYSIS, MICROSCOPIC ONLY
Casts: NONE SEEN [LPF]
Crystals: NONE SEEN [HPF]
Yeast: NONE SEEN [HPF]

## 2016-06-19 LAB — MICROALBUMIN / CREATININE URINE RATIO
Creatinine, Urine: 166 mg/dL (ref 20–320)
MICROALB UR: 0.7 mg/dL
MICROALB/CREAT RATIO: 4 ug/mg{creat} (ref <30)

## 2016-06-19 LAB — TSH: TSH: 0.98 mIU/L

## 2016-06-19 MED ORDER — CIPROFLOXACIN HCL 500 MG PO TABS: 500.0000 mg | ORAL_TABLET | Freq: Two times a day (BID) | ORAL | Status: AC

## 2016-06-21 LAB — URINE CULTURE: Colony Count: >=100000

## 2016-06-22 NOTE — Addendum Note (Signed)
Addended by: Vladimir Crofts on: 06/22/2016 06:56 AM   Modules accepted: Orders

## 2016-07-21 ENCOUNTER — Other Ambulatory Visit: Payer: Self-pay

## 2017-01-12 ENCOUNTER — Encounter (INDEPENDENT_AMBULATORY_CARE_PROVIDER_SITE_OTHER): Payer: Self-pay

## 2017-01-12 ENCOUNTER — Encounter: Payer: Self-pay | Admitting: Internal Medicine

## 2017-01-12 ENCOUNTER — Ambulatory Visit (INDEPENDENT_AMBULATORY_CARE_PROVIDER_SITE_OTHER): Payer: 59 | Admitting: Internal Medicine

## 2017-01-12 VITALS — BP 118/66 | HR 82 | Temp 98.2°F | Resp 16 | Ht 64.0 in | Wt 133.0 lb

## 2017-01-12 DIAGNOSIS — Z113 Encounter for screening for infections with a predominantly sexual mode of transmission: Secondary | ICD-10-CM

## 2017-01-12 DIAGNOSIS — N9089 Other specified noninflammatory disorders of vulva and perineum: Secondary | ICD-10-CM

## 2017-01-12 MED ORDER — VALACYCLOVIR HCL 500 MG PO TABS: 1000.0000 mg | ORAL_TABLET | Freq: Two times a day (BID) | ORAL | 0 refills | Status: DC

## 2017-01-12 NOTE — Progress Notes (Signed)
Assessment and Plan:   1. Screening for STD (sexually transmitted disease)  - HSV(herpes simplex vrs) 1+2 ab-IgG - HIV antibody - RPR - GC/Chlamydia Probe Amp - WET PREP BY MOLECULAR PROBE - Hepatitis C antibody  2. Vulvar lesion -likely genital herpes but will confirm with HSV testing. -Valtrex 1,000 mg BID x 7 days -consider daily prophylaxis if multiple outbreaks -went over the disease state with the patient including the fact that this infection could have remained latent or dormant and this could be from a different partner.  Recommended that her current partners get tested.   - HSV(herpes simplex vrs) 1+2 ab-IgG     HPI 51 y.o.female presents for STD testing.  Patient reports that she found out approximately a month ago that her partner had other partners as well. She reports that she did have an area on her labia yesterday.  It is nearly gone today.  She reports that there is no tingling, itching, or any other symptoms.  She reports that she does not shave or wax near the spots.  They are irritated by her panty line.  She denies any vaginal discharge, pain when urinating, abdominal pain, nausea, vomiting, or fever.  She reports that she has been having unprotected sex.  No fever, chills, nausea, vomiting.    Past Medical History:  Diagnosis Date  . Depression   . HSV-1 (herpes simplex virus 1) infection   . Vitamin D deficiency      No Active Allergies    Current Outpatient Prescriptions on File Prior to Visit  Medication Sig Dispense Refill  . BIOTIN PO Take 1 tablet by mouth daily.    Marland Kitchen LYSINE PO Take 1 tablet by mouth daily.    . Multiple Vitamin (MULTIVITAMIN) tablet Take 1 tablet by mouth daily.     No current facility-administered medications on file prior to visit.     ROS: all negative except above.   Physical Exam: Filed Weights   01/12/17 1133  Weight: 133 lb (60.3 kg)   Ht 5\' 4"  (1.626 m)   Wt 133 lb (60.3 kg)   LMP 05/31/2013   BMI 22.83 kg/m   General Appearance: Well developed well nourished, non-toxic appearing in no apparent distress. Eyes: PERRLA, EOMs, conjunctiva w/ no swelling or erythema or discharge Sinuses: No Frontal/maxillary tenderness ENT/Mouth: Ear canals clear without swelling or erythema.  TM's normal bilaterally with no retractions, bulging, or loss of landmarks.   Neck: Supple, thyroid normal, no notable JVD  Respiratory: Respiratory effort normal, Clear breath sounds anteriorly and posteriorly bilaterally without rales, rhonchi, wheezing or stridor. No retractions or accessory muscle usage. Cardio: RRR with no MRGs.   Abdomen: Soft, + BS.  Non tender, no guarding, rebound, hernias, masses.  Musculoskeletal: Full ROM, 5/5 strength, normal gait.  GU:  Normal external female genitalia.  There is a cluster of vessicles on the right labia with a mildly erythematous base.  There is some swelling of the skenes gland.  Vaginal mucosa is moist and pink.  There is a white vaginal discharge in the posterior vaginal vault.  No CMT.  No adnexal fullness.   Skin: Warm, dry without rashes  Neuro: Awake and oriented X 3, Cranial nerves intact. Normal muscle tone, no cerebellar symptoms. Sensation intact.  Psych: normal affect, Insight and Judgment appropriate.     Starlyn Skeans, PA-C 11:39 AM [facility]

## 2017-01-12 NOTE — Patient Instructions (Signed)

## 2017-01-13 LAB — HIV ANTIBODY (ROUTINE TESTING W REFLEX): HIV 1&2 Ab, 4th Generation: NONREACTIVE

## 2017-01-13 LAB — WET PREP BY MOLECULAR PROBE
CANDIDA SPECIES: NEGATIVE
Gardnerella vaginalis: NEGATIVE
Trichomonas vaginosis: NEGATIVE

## 2017-01-13 LAB — HSV(HERPES SIMPLEX VRS) I + II AB-IGG
HSV 1 Glycoprotein G Ab, IgG: 23.3 Index — ABNORMAL HIGH (ref <0.90)
HSV 2 Glycoprotein G Ab, IgG: 4.14 Index — ABNORMAL HIGH (ref <0.90)

## 2017-01-13 LAB — GC/CHLAMYDIA PROBE AMP
CT Probe RNA: NOT DETECTED
GC Probe RNA: NOT DETECTED

## 2017-01-13 LAB — HEPATITIS C ANTIBODY: HCV Ab: NEGATIVE

## 2017-01-13 LAB — RPR

## 2017-06-21 NOTE — Progress Notes (Signed)
Complete Physical  Assessment and Plan:  HSV-1 and HSV 2  infection  - continue supplement --     valACYclovir (VALTREX) 500 MG tablet; Take 2 tablets (1,000 mg total) by mouth 2 (two) times daily.  Depression -     TSH - in remission  Vitamin D deficiency  - continue medication  Routine general medical examination at a health care facility -     CBC with Differential/Platelet -     BASIC METABOLIC PANEL WITH GFR -     Hepatic function panel -     TSH -     Lipid panel -     Urinalysis, Routine w reflex microscopic (not at [facility]) -     Microalbumin / creatinine urine ratio -     EKG 12-Lead  Medication management -     CBC with Differential/Platelet -     BASIC METABOLIC PANEL WITH GFR -     Hepatic function panel  Dense breast tissue  - get 3D MGM  Screening cholesterol level -     Lipid panel  Biceps tendinitis on left Aleve, stretches given, if not better will refer to ortho  Screening, anemia, deficiency, iron -     Iron and TIBC -     Vitamin B12  Screening for blood or protein in urine -     Urinalysis, Routine w reflex microscopic (not at [facility]) -     Microalbumin / creatinine urine ratio  Discussed med's effects and SE's. Screening labs and tests as requested with regular follow-up as recommended.  HPI 51 y.o. DAA female  presents for a complete physical. Her blood pressure has been controlled at home, today their BP is BP: 120/80 She does workout. She denies chest pain, shortness of breath, dizziness.  She is not on cholesterol medication and denies myalgias. Her cholesterol is at goal. The cholesterol last visit was:   Lab Results  Component Value Date   CHOL 157 06/18/2016   HDL 69 06/18/2016   LDLCALC 73 06/18/2016   TRIG 77 06/18/2016   CHOLHDL 2.3 06/18/2016   Last A1C in the office was:  Lab Results  Component Value Date   HGBA1C 5.1 06/15/2014   Patient is on Vitamin D supplement: Lab Results  Component Value Date   VD25OH 108  06/19/2015   She is off estrogen at this time and states she is doing well.  Has history of HSV1 and HSV2, 1 outbreak, has not had one since that time.  Has custody of granddaughter, [name], will be in 4rd grade this year.  BMI is Body mass index is 22.49 kg/m. she is working on diet and exercise. Wt Readings from Last 3 Encounters:  06/22/17 131 lb (59.4 kg)  01/12/17 133 lb (60.3 kg)  06/18/16 145 lb 3.2 oz (65.9 kg)     Current Medications:  Current Outpatient Prescriptions on File Prior to Visit  Medication Sig  . BIOTIN PO Take 1 tablet by mouth daily.  Marland Kitchen LYSINE PO Take 1 tablet by mouth daily.  . Multiple Vitamin (MULTIVITAMIN) tablet Take 1 tablet by mouth daily.   No current facility-administered medications on file prior to visit.     Health Maintenance:   Immunization History  Administered Date(s) Administered  . Influenza-Unspecified 10/18/2013, 10/17/2014, 08/27/2015  . Tdap 02/08/2008   Tetanus: 2009 Pneumovax: N/A Prevnar 13: N/A Flu vaccine: 2017 at work Zostavax: N/A  Pap: 04/2013  TAH 06/2013  US Pelvis: 05/2013 3DMGM: 04/27/2016 Screening, DMGM  RT breast with normal BX DUE DEXA: N/A Colonoscopy: N/A EGD: N/A DEE: Dr. Sabra Heck glasses, 02/2014 Dentist: Dr. Clearence Cheek in [place]  Routine labs RPR negative 05/2014 HIV negative 05/2014  Medical History:  Past Medical History:  Diagnosis Date  . Depression   . HSV-1 (herpes simplex virus 1) infection   . Vitamin D deficiency   Allergies No Active Allergies  SURGICAL HISTORY She  has a past surgical history that includes Tonsillectomy and adenoidectomy; Novasure ablation; and Abdominal hysterectomy. FAMILY HISTORY Her family history includes Cancer in her maternal grandfather; Leukemia in her maternal aunt. SOCIAL HISTORY She  reports that she has never smoked. She has never used smokeless tobacco. She reports that she does not drink alcohol or use drugs.   Review of Systems  Constitutional:  Negative.   HENT: Negative.   Eyes: Negative.   Respiratory: Negative.   Cardiovascular: Negative.   Gastrointestinal: Negative.   Genitourinary: Negative.   Musculoskeletal: Positive for joint pain (left elbow).  Skin: Negative.   Neurological: Negative.   Endo/Heme/Allergies: Negative.   Psychiatric/Behavioral: Negative.    Physical Exam: Estimated body mass index is 22.49 kg/m as calculated from the following:   Height as of this encounter: 5\' 4"  (1.626 m).   Weight as of this encounter: 131 lb (59.4 kg). BP 120/80   Pulse 69   Temp 97.7 F (36.5 C) (Temporal)   Resp 16   Ht 5\' 4"  (1.626 m)   Wt 131 lb (59.4 kg)   LMP 05/31/2013   BMI 22.49 kg/m  General Appearance: Well nourished, in no apparent distress. Eyes: PERRLA, EOMs, conjunctiva no swelling or erythema, normal fundi and vessels. Sinuses: No Frontal/maxillary tenderness ENT/Mouth: Ext aud canals clear, normal light reflex with TMs without erythema, bulging.  Good dentition. No erythema, swelling, or exudate on post pharynx. Tonsils not swollen or erythematous. Hearing normal.  Neck: Supple, thyroid normal. No bruits Respiratory: Respiratory effort normal, BS equal bilaterally without rales, rhonchi, wheezing or stridor. Cardio: RRR without murmurs, rubs or gallops. Brisk peripheral pulses without edema.  Chest: symmetric, with normal excursions and percussion. Breasts: defer Abdomen: Soft, +BS. Non tender, no guarding, rebound, hernias, masses, or organomegaly. .  Lymphatics: Non tender without lymphadenopathy.  Genitourinary: defer Musculoskeletal: Full ROM all peripheral extremities,5/5 strength, and normal gait. Skin: Tattoo on left lateral ankle. Warm, dry without rashes, lesions, ecchymosis.  Neuro: Cranial nerves intact, reflexes equal bilaterally. Normal muscle tone, no cerebellar symptoms. Sensation intact.  Psych: Awake and oriented X 3, normal affect, Insight and Judgment appropriate.   EKG:   defer   Vicie Mutters 9:03 AM

## 2017-06-22 ENCOUNTER — Ambulatory Visit (INDEPENDENT_AMBULATORY_CARE_PROVIDER_SITE_OTHER): Payer: 59 | Admitting: Physician Assistant

## 2017-06-22 ENCOUNTER — Other Ambulatory Visit: Payer: Self-pay | Admitting: Physician Assistant

## 2017-06-22 ENCOUNTER — Encounter: Payer: Self-pay | Admitting: Physician Assistant

## 2017-06-22 VITALS — BP 120/80 | HR 69 | Temp 97.7°F | Resp 16 | Ht 64.0 in | Wt 131.0 lb

## 2017-06-22 DIAGNOSIS — B009 Herpesviral infection, unspecified: Secondary | ICD-10-CM | POA: Diagnosis not present

## 2017-06-22 DIAGNOSIS — Z79899 Other long term (current) drug therapy: Secondary | ICD-10-CM | POA: Diagnosis not present

## 2017-06-22 DIAGNOSIS — R922 Inconclusive mammogram: Secondary | ICD-10-CM

## 2017-06-22 DIAGNOSIS — M7522 Bicipital tendinitis, left shoulder: Secondary | ICD-10-CM | POA: Diagnosis not present

## 2017-06-22 DIAGNOSIS — R9230 Dense breasts, unspecified: Secondary | ICD-10-CM

## 2017-06-22 DIAGNOSIS — Z1389 Encounter for screening for other disorder: Secondary | ICD-10-CM | POA: Diagnosis not present

## 2017-06-22 DIAGNOSIS — Z13220 Encounter for screening for lipoid disorders: Secondary | ICD-10-CM | POA: Diagnosis not present

## 2017-06-22 DIAGNOSIS — F3342 Major depressive disorder, recurrent, in full remission: Secondary | ICD-10-CM

## 2017-06-22 DIAGNOSIS — E559 Vitamin D deficiency, unspecified: Secondary | ICD-10-CM | POA: Diagnosis not present

## 2017-06-22 DIAGNOSIS — Z0000 Encounter for general adult medical examination without abnormal findings: Secondary | ICD-10-CM | POA: Diagnosis not present

## 2017-06-22 DIAGNOSIS — Z130 Encounter for screening for diseases of the blood and blood-forming organs and certain disorders involving the immune mechanism: Secondary | ICD-10-CM

## 2017-06-22 LAB — CBC WITH DIFFERENTIAL/PLATELET
BASOS ABS: 60 {cells}/uL (ref 0–200)
Basophils Relative: 1 %
EOS PCT: 2 %
Eosinophils Absolute: 120 cells/uL (ref 15–500)
HEMATOCRIT: 42.4 % (ref 35.0–45.0)
HEMOGLOBIN: 14.1 g/dL (ref 11.7–15.5)
LYMPHS ABS: 1740 {cells}/uL (ref 850–3900)
Lymphocytes Relative: 29 %
MCH: 29.9 pg (ref 27.0–33.0)
MCHC: 33.3 g/dL (ref 32.0–36.0)
MCV: 90 fL (ref 80.0–100.0)
MONO ABS: 480 {cells}/uL (ref 200–950)
MPV: 9.4 fL (ref 7.5–12.5)
Monocytes Relative: 8 %
NEUTROS ABS: 3600 {cells}/uL (ref 1500–7800)
NEUTROS PCT: 60 %
Platelets: 261 10*3/uL (ref 140–400)
RBC: 4.71 MIL/uL (ref 3.80–5.10)
RDW: 12.8 % (ref 11.0–15.0)
WBC: 6 10*3/uL (ref 3.8–10.8)

## 2017-06-22 LAB — TSH: TSH: 0.68 mIU/L

## 2017-06-22 MED ORDER — VALACYCLOVIR HCL 500 MG PO TABS: 1000.0000 mg | ORAL_TABLET | Freq: Two times a day (BID) | ORAL | 0 refills | Status: AC

## 2017-06-22 NOTE — Patient Instructions (Signed)
Biceps Tendon Disruption (Distal) Rehab Ask your health care provider which exercises are safe for you. Do exercises exactly as told by your health care provider and adjust them as directed. It is normal to feel mild stretching, pulling, tightness, or discomfort as you do these exercises, but you should stop right away if you feel sudden pain or your pain gets worse.Do not begin these exercises until told by your health care provider. Stretching and range of motion exercises These exercises warm up your muscles and joints and improve the movement and flexibility of your arm. These exercises can also help to relieve pain, numbness, and tingling. Exercise A: Elbow flexion, passive  1. Stand or sit with your left / right arm at your side. 2. Use your other hand to gently push your left / right hand toward your shoulder. Bend your elbow as far as your health care provider tells you to. You may be instructed to try to bend your elbow more and more over time. 3. Hold for __________ seconds. 4. Slowly return to the starting position. Repeat __________ times. Complete this exercise __________ times a day. Exercise B: Supination, passive 1. Sit with your left / right elbow bent to an "L" shape (90 degrees) and your forearm resting on a table, palm-down. 2. Keeping your upper body and shoulder still, use your other hand to rotate your left / right palm up. Stop when you feel a gentle to moderate stretch. 3. Hold for __________ seconds. 4. Slowly return to the starting position. Repeat __________ times. Complete this exercise __________ times a day. Exercise C: Pronation, passive  1. Sit with your left / right elbow bent to an "L" shape (90 degrees) and your forearm resting on a table, palm-up. 2. Keeping your upper body and shoulder still, use your other hand to rotate your left / right palm down. Stop when you feel a gentle to moderate stretch. 3. Hold for __________ seconds. 4. Slowly return to the  starting position. Repeat __________ times. Complete this exercise __________ times a day. Exercise D: Elbow extension, active Start this exercise only when your health care provider tells you. 1. Stand or sit with your left / right elbow bent and your palm facing in, toward your body. 2. Slowly straighten your elbow using only your arm muscles. Stop when you feel a very slight stretch at the front of your arm, or when you reach the position where your health care provider tells you to stop. You may be instructed to try to straighten your arm more and more each week. 3. Hold for __________ seconds. 4. Slowly return to the starting position. Repeat __________ times. Complete this exercise __________ times a day. Exercise E: Elbow flexion, active Start this exercise only when your health care provider tells you. 1. Stand or sit with your left / right elbow bent and your palm facing in, toward your body. 2. Bend your elbow as far as you can using only your arm muscles. 3. Hold for __________ seconds. 4. Slowly return to the starting position. Repeat __________ times. Complete this exercise __________ times a day. Exercise F: Supination, active Start this exercise only when your health care provider tells you. 1. Stand or sit with your left / right elbow bent to an "L" shape (90 degrees). 2. Rotate your palm up until you feel a gentle stretch on the inside of your forearm. 3. Hold for __________ seconds. 4. Slowly return to the starting position. Repeat __________ times. Complete this exercise  __________ times a day. Exercise G: Pronation, active  Start this exercise only when your health care provider tells you. 1. Stand or sit with your left / right elbow bent to an "L" shape (90 degrees). 2. Rotate your palm down until you feel a gentle stretch on the top of your forearm. 3. Hold for __________ seconds. 4. Slowly release and return to the starting position. Repeat __________ times.  Complete this exercise __________ times a day. Exercise H: Elbow extension, passive, supine 1. Lie on your back in a comfortable position that allows you to relax your arm muscles. 2. Place a folded towel under your left / right upper arm so your elbow and shoulder are at the same height. 3. Use your other arm to raise your left / right arm until your elbow does not rest on the bed or towel. Hold your left / right arm out straight with your other hand supporting it. Let the weight of your hand stretch the inside of your elbow. ? Keep your arm and chest muscles relaxed. ? If directed, you may hold a __________ weight in your hand to increase the intensity of the stretch. 4. Hold for __________ seconds. 5. Slowly release the stretch. Repeat __________ times. Complete this exercise __________ times a day. Strengthening exercises These exercises build strength and endurance in your arm and shoulder. Endurance is the ability to use your muscles for a long time, even after your muscles get tired. Do not start any strengthening exercises until told by your health care provider. Exercise I: Elbow flexion, isometric  1. Stand or sit with your left / right arm at waist height. Your palm should face in, toward your body. 2. Place your other hand on top of your left / right forearm. Gently push down while you resist with your left / right arm. ? Use about half (50%) effort with both arms. You may be instructed to use more and more effort with your arms each week. ? Try not to let your right / left elbow move. 3. Hold for __________ seconds. 4. Let your muscles relax completely before you repeat this exercise. Repeat __________ times. Complete this exercise __________ times a day. Exercise J: Elbow extension, isometric  1. Stand or sit with your left / right arm at waist height. Your palm should face in, toward your body. 2. Place your other hand on the bottom of your left / right forearm. Gently push up  while you resist with your left / right arm. ? Use about half (50%) effort with both arms. You may be instructed to use more and more effort with your arms each week. ? Try not to let your left / right elbow move. 3. Hold for __________ seconds. 4. Let your muscles relax completely before you repeat this exercise. Repeat __________ times. Complete this exercise __________ times a day. Exercise K: Biceps curls ( elbow flexion, supinated) 1. Sit on a stable chair without armrests, or stand. 2. Hold a __________ weight in your left / right hand. Your palm should face out, away from your body, at the starting position. 3. Bend your left / right elbow and move your hand up toward your shoulder. 4. Slowly return to the starting position. Repeat __________ times. Complete this exercise __________ times a day. Exercise L: Hammer curls ( elbow flexion, neutral forearm) 1. Sit on a stable chair without armrests, or stand. 2. Hold a __________ weight in your left / right hand, or hold an exercise  band with both hands. Your palms should face each other at the starting position. 3. Bend your left / right elbow and move your hand up toward your shoulder. Keep your other arm straight. 4. Slowly return to the starting position. Repeat __________ times. Complete this exercise __________ times a day. Exercise M: Triceps curls ( elbow extension) 1. Lie on your back. 2. Hold a __________ weight in your left / right hand. 3. Bend your left / right elbow to an "L" shape (90 degrees) so the weight is in front of your face, over your chest, and your elbow is pointed up to the ceiling. 4. Straighten your elbow, raising your hand toward the ceiling. Use your other hand to support your left / right upper arm. 5. Slowly return to the starting position. Repeat __________ times. Complete this exercise __________ times a day. Exercise N: Elbow extension with exercise band  1. Sit on a stable chair without armrests,  or stand. 2. Hold an exercise band in both hands. 3. Keeping your upper arms at your side, bring both hands up to your shoulder. Keep your left / right hand just below your other hand. 4. Straighten your left / right elbow while keeping your other arm still. 5. Hold for __________ seconds. 6. Slowly bend your elbow to return to the starting position. Repeat __________ times. Complete this exercise __________ times a day. Exercise O: Supination  1. Sit with the your left / right forearm supported on a table. Your elbow should be at waist height. 2. Rest your hand over the edge of the table, palm-down. 3. Gently grasp a lightweight hammer. 4. Without moving your left / right elbow, slowly rotate your palm up, to a "thumbs-up" position. 5. Hold for__________ seconds. 6. Slowly return to the starting position. Repeat __________ times. Complete this exercise __________ times a day. Exercise P: Pronation  1. Sit with your left / right forearm supported on a table. Your elbow should be at waist height. 2. Rest your hand over the edge of the table, palm-up. 3. Gently grasp a lightweight hammer. 4. Without moving your left / right elbow, slowly rotate your palm down. 5. Hold for __________ seconds. 6. Slowly return to the starting position. Repeat __________ times. Complete this exercise__________ times a day. This information is not intended to replace advice given to you by your health care provider. Make sure you discuss any questions you have with your health care provider. Document Released: 12/14/2005 Document Revised: 08/20/2016 Document Reviewed: 09/08/2015 Elsevier Interactive Patient Education  [employer].

## 2017-06-23 ENCOUNTER — Other Ambulatory Visit: Payer: Self-pay | Admitting: Physician Assistant

## 2017-06-23 ENCOUNTER — Encounter: Payer: Self-pay | Admitting: Physician Assistant

## 2017-06-23 LAB — MICROALBUMIN / CREATININE URINE RATIO
Creatinine, Urine: 78 mg/dL (ref 20–320)
MICROALB/CREAT RATIO: 3 ug/mg{creat} (ref <30)
Microalb, Ur: 0.2 mg/dL

## 2017-06-23 LAB — URINALYSIS, MICROSCOPIC ONLY
CRYSTALS: NONE SEEN [HPF]
Casts: NONE SEEN [LPF]
RBC / HPF: NONE SEEN RBC/HPF (ref <=2)
YEAST: NONE SEEN [HPF]

## 2017-06-23 LAB — IRON AND TIBC
%SAT: 52 % — AB (ref 11–50)
Iron: 177 ug/dL (ref 40–190)
TIBC: 340 ug/dL (ref 250–450)
UIBC: 163 ug/dL

## 2017-06-23 LAB — LIPID PANEL
CHOL/HDL RATIO: 2.1 ratio (ref <5.0)
Cholesterol: 152 mg/dL (ref <200)
HDL: 72 mg/dL (ref >50)
LDL CALC: 69 mg/dL (ref <100)
Triglycerides: 54 mg/dL (ref <150)
VLDL: 11 mg/dL (ref <30)

## 2017-06-23 LAB — HEPATIC FUNCTION PANEL
ALK PHOS: 57 U/L (ref 33–115)
ALT: 17 U/L (ref 6–29)
AST: 18 U/L (ref 10–35)
Albumin: 4.3 g/dL (ref 3.6–5.1)
BILIRUBIN DIRECT: 0.2 mg/dL (ref <=0.2)
BILIRUBIN INDIRECT: 0.6 mg/dL (ref 0.2–1.2)
TOTAL PROTEIN: 6.5 g/dL (ref 6.1–8.1)
Total Bilirubin: 0.8 mg/dL (ref 0.2–1.2)

## 2017-06-23 LAB — URINALYSIS, ROUTINE W REFLEX MICROSCOPIC
BILIRUBIN URINE: NEGATIVE
GLUCOSE, UA: NEGATIVE
Hgb urine dipstick: NEGATIVE
KETONES UR: NEGATIVE
Nitrite: POSITIVE — AB
Protein, ur: NEGATIVE
Specific Gravity, Urine: 1.013 (ref 1.001–1.035)
pH: 6 (ref 5.0–8.0)

## 2017-06-23 LAB — BASIC METABOLIC PANEL WITH GFR
BUN: 15 mg/dL (ref 7–25)
CALCIUM: 10 mg/dL (ref 8.6–10.2)
CHLORIDE: 104 mmol/L (ref 98–110)
CO2: 25 mmol/L (ref 20–31)
Creat: 0.84 mg/dL (ref 0.50–1.10)
GFR, EST NON AFRICAN AMERICAN: 82 mL/min (ref >=60)
GFR, Est African American: >89 mL/min (ref >=60)
GLUCOSE: 59 mg/dL — AB (ref 65–99)
Potassium: 4.2 mmol/L (ref 3.5–5.3)
SODIUM: 141 mmol/L (ref 135–146)

## 2017-06-23 LAB — VITAMIN B12: Vitamin B-12: 700 pg/mL (ref 200–1100)

## 2017-06-23 LAB — VITAMIN D 25 HYDROXY (VIT D DEFICIENCY, FRACTURES): VIT D 25 HYDROXY: 41 ng/mL (ref 30–100)

## 2017-06-23 LAB — MAGNESIUM: MAGNESIUM: 2.1 mg/dL (ref 1.5–2.5)

## 2017-06-23 MED ORDER — CIPROFLOXACIN HCL 500 MG PO TABS: 500.0000 mg | ORAL_TABLET | Freq: Two times a day (BID) | ORAL | 0 refills | Status: DC

## 2017-06-23 NOTE — Progress Notes (Signed)
Pt aware of lab results & voiced understanding of those results.

## 2017-06-25 ENCOUNTER — Other Ambulatory Visit: Payer: Self-pay | Admitting: Physician Assistant

## 2017-06-25 DIAGNOSIS — R350 Frequency of micturition: Secondary | ICD-10-CM

## 2017-06-25 LAB — URINE CULTURE

## 2017-06-28 NOTE — Progress Notes (Signed)
LVM for pt to return office call for LAB results.

## 2017-07-05 NOTE — Progress Notes (Signed)
Pt aware of lab results & voiced understanding of those results. PT WILL CALL TOMORROW TO SCHEDULE A 1 MTH DUE WORK SCHEDULE.

## 2017-10-19 DIAGNOSIS — Z23 Encounter for immunization: Secondary | ICD-10-CM | POA: Diagnosis not present

## 2018-01-26 ENCOUNTER — Other Ambulatory Visit: Payer: Self-pay

## 2018-01-26 MED ORDER — VALACYCLOVIR HCL 500 MG PO TABS: 1000.0000 mg | ORAL_TABLET | Freq: Two times a day (BID) | ORAL | 0 refills | Status: AC

## 2018-06-23 ENCOUNTER — Encounter: Payer: Self-pay | Admitting: Physician Assistant

## 2018-07-05 NOTE — Progress Notes (Signed)
Complete Physical  Assessment and Plan:  Routine general medical examination at a health care facility -     CBC with Differential/Platelet -     CMP/GFR -     TSH -     Lipid panel -     Urinalysis, Routine w reflex microscopic (not at [facility]) -     Microalbumin / creatinine urine ratio  HSV-1 and HSV 2  infection        - continue supplement        -     valACYclovir (VALTREX) 500 MG tablet; Take 2 tablets (1,000 mg total) by mouth 2 (two) times daily.  Depression -     TSH - in remission  Vitamin D deficiency  - continue medication  Dense breast tissue/fibroadenoma  - get 3D MGM  Screening cholesterol level -     Lipid panel  Screening, anemia -     Vitamin B12  Screening for blood or protein in urine -     Urinalysis, Routine w reflex microscopic (not at [facility]) -     Microalbumin / creatinine urine ratio  Screening colon cancer Colonoscopy- patient declines a colonoscopy even though the risks and benefits were discussed at length. Colon cancer is 3rd most diagnosed cancer and 2nd leading cause of death in both men and women 66 years of age and older. Patient understands the risk of cancer and death with declining the test however they are willing to do cologuard screening instead. They understand that this is not as sensitive or specific as a colonoscopy and they are still recommended to get a colonoscopy. The cologuard will be sent out to their house.    Discussed med's effects and SE's. Screening labs and tests as requested with regular follow-up as recommended.  Future Appointments  Date Time Provider [place]  07/10/2019  9:00 AM Liane Comber, NP [facility] None     HPI 52 y.o. Julie Cochran female  presents for a complete physical. She has HSV-1 (herpes simplex virus 1) infection; Depression; Vitamin D deficiency; Medication management; and Dense breast tissue on their problem list.   Has custody of granddaughter, Julie Cochran, will be in 4th grade this year.    Has history of HSV1 and HSV2, last outbreak 3 months ago, has 2-3/year.   BMI is Body mass index is 24.46 kg/m., she has been working on diet and exercise. She has quit sprite and is drinking water now.  Wt Readings from Last 3 Encounters:  07/06/18 141 lb 6.4 oz (64.1 kg)  06/22/17 131 lb (59.4 kg)  01/12/17 133 lb (60.3 kg)   Her blood pressure has been controlled at home, today their BP is BP: 134/62 She does workout. She denies chest pain, shortness of breath, dizziness.   She is not on cholesterol medication and denies myalgias. Her cholesterol is at goal. The cholesterol last visit was:   Lab Results  Component Value Date   CHOL 152 06/22/2017   HDL 72 06/22/2017   LDLCALC 69 06/22/2017   TRIG 54 06/22/2017   CHOLHDL 2.1 06/22/2017   Last A1C in the office was:  Lab Results  Component Value Date   HGBA1C 5.1 06/15/2014   Patient is on Vitamin D supplement: Lab Results  Component Value Date   VD25OH 41 06/22/2017      Current Medications:  Current Outpatient Medications on File Prior to Visit  Medication Sig  . BIOTIN PO Take 1 tablet by mouth daily.  Marland Kitchen LYSINE PO Take  1 tablet by mouth daily.  . Multiple Vitamin (MULTIVITAMIN) tablet Take 1 tablet by mouth daily.   No current facility-administered medications on file prior to visit.     Health Maintenance:   Immunization History  Administered Date(s) Administered  . Influenza-Unspecified 10/18/2013, 10/17/2014, 08/27/2015  . Td 07/06/2018  . Tdap 02/08/2008   Tetanus: 2019 Pneumovax: N/A Prevnar 13: N/A Flu vaccine: 2018 at work Zostavax: N/A  Pap: 04/2013, had TAH 06/2013  US Pelvis: 05/2013 3DMGM: 05/06/2016 Screening, DMGM RT breast with normal BX DUE  DEXA: N/A Colonoscopy: DUE - wants to do cologuard EGD: N/A  DEE: Dr. Sabra Heck glasses, 02/2014 Dentist: [facility], last visit 01/2018, goes q24m  Routine labs RPR negative 05/2014 HIV negative 05/2014  Medical History:  Past  Medical History:  Diagnosis Date  . Depression   . HSV-1 (herpes simplex virus 1) infection   . Vitamin D deficiency   Allergies No Active Allergies  SURGICAL HISTORY She  has a past surgical history that includes Tonsillectomy and adenoidectomy; Novasure ablation; and Abdominal hysterectomy (2014). FAMILY HISTORY Her family history includes Breast cancer in her maternal grandmother; Leukemia in her paternal aunt. SOCIAL HISTORY She  reports that she has never smoked. She has never used smokeless tobacco. She reports that she does not drink alcohol or use drugs.   Review of Systems  Constitutional: Negative.  Negative for malaise/fatigue and weight loss.  HENT: Negative.  Negative for hearing loss and tinnitus.   Eyes: Negative.  Negative for blurred vision and double vision.  Respiratory: Negative.  Negative for cough, sputum production, shortness of breath and wheezing.   Cardiovascular: Negative.  Negative for chest pain, palpitations, orthopnea, claudication, leg swelling and PND.  Gastrointestinal: Negative.  Negative for abdominal pain, blood in stool, constipation, diarrhea, heartburn, melena, nausea and vomiting.  Genitourinary: Negative.   Musculoskeletal: Negative for falls, joint pain and myalgias.  Skin: Negative.  Negative for rash.  Neurological: Negative.  Negative for dizziness, tingling, sensory change, weakness and headaches.  Endo/Heme/Allergies: Negative.  Negative for polydipsia.  Psychiatric/Behavioral: Negative.  Negative for depression, memory loss, substance abuse and suicidal ideas. The patient is not nervous/anxious and does not have insomnia.   All other systems reviewed and are negative.  Physical Exam: Estimated body mass index is 24.46 kg/m as calculated from the following:   Height as of this encounter: 5' 3.75" (1.619 m).   Weight as of this encounter: 141 lb 6.4 oz (64.1 kg). BP 134/62   Pulse 66   Temp (!) 97.5 F (36.4 C)   Ht 5' 3.75"  (1.619 m)   Wt 141 lb 6.4 oz (64.1 kg)   LMP 05/31/2013   SpO2 98%   BMI 24.46 kg/m  General Appearance: Well nourished, in no apparent distress. Eyes: PERRLA, EOMs, conjunctiva no swelling or erythema, normal fundi and vessels. Sinuses: No Frontal/maxillary tenderness ENT/Mouth: Ext aud canals clear, normal light reflex with TMs without erythema, bulging.  Good dentition. No erythema, swelling, or exudate on post pharynx. Tonsils not swollen or erythematous. Hearing normal.  Neck: Supple, thyroid normal. No bruits Respiratory: Respiratory effort normal, BS equal bilaterally without rales, rhonchi, wheezing or stridor. Cardio: RRR without murmurs, rubs or gallops. Brisk peripheral pulses without edema.  Chest: symmetric, with normal excursions and percussion. Breasts: Symmetrical without palpable lumps, no discharge  Abdomen: Soft, +BS. Non tender, no guarding, rebound, hernias, masses, or organomegaly. .  Lymphatics: Non tender without lymphadenopathy.  Genitourinary: defer Musculoskeletal: Full ROM all peripheral extremities,5/5  strength, and normal gait. Skin: Tattoo on left lateral ankle. Warm, dry without rashes, lesions, ecchymosis.  Neuro: Cranial nerves intact, reflexes equal bilaterally. Normal muscle tone, no cerebellar symptoms. Sensation intact.  Psych: Awake and oriented X 3, normal affect, Insight and Judgment appropriate.   EKG:  defer   Julie Cochran 9:33 AM

## 2018-07-06 ENCOUNTER — Ambulatory Visit (INDEPENDENT_AMBULATORY_CARE_PROVIDER_SITE_OTHER): Payer: 59 | Admitting: Adult Health

## 2018-07-06 ENCOUNTER — Encounter: Payer: Self-pay | Admitting: Adult Health

## 2018-07-06 VITALS — BP 134/62 | HR 66 | Temp 97.5°F | Ht 63.75 in | Wt 141.4 lb

## 2018-07-06 DIAGNOSIS — F3342 Major depressive disorder, recurrent, in full remission: Secondary | ICD-10-CM

## 2018-07-06 DIAGNOSIS — Z13220 Encounter for screening for lipoid disorders: Secondary | ICD-10-CM

## 2018-07-06 DIAGNOSIS — Z131 Encounter for screening for diabetes mellitus: Secondary | ICD-10-CM

## 2018-07-06 DIAGNOSIS — Z79899 Other long term (current) drug therapy: Secondary | ICD-10-CM | POA: Diagnosis not present

## 2018-07-06 DIAGNOSIS — R922 Inconclusive mammogram: Secondary | ICD-10-CM

## 2018-07-06 DIAGNOSIS — Z1329 Encounter for screening for other suspected endocrine disorder: Secondary | ICD-10-CM

## 2018-07-06 DIAGNOSIS — Z23 Encounter for immunization: Secondary | ICD-10-CM

## 2018-07-06 DIAGNOSIS — Z1389 Encounter for screening for other disorder: Secondary | ICD-10-CM

## 2018-07-06 DIAGNOSIS — Z0000 Encounter for general adult medical examination without abnormal findings: Secondary | ICD-10-CM

## 2018-07-06 DIAGNOSIS — D241 Benign neoplasm of right breast: Secondary | ICD-10-CM

## 2018-07-06 DIAGNOSIS — E559 Vitamin D deficiency, unspecified: Secondary | ICD-10-CM | POA: Diagnosis not present

## 2018-07-06 DIAGNOSIS — Z130 Encounter for screening for diseases of the blood and blood-forming organs and certain disorders involving the immune mechanism: Secondary | ICD-10-CM

## 2018-07-06 DIAGNOSIS — Z1211 Encounter for screening for malignant neoplasm of colon: Secondary | ICD-10-CM

## 2018-07-06 DIAGNOSIS — B009 Herpesviral infection, unspecified: Secondary | ICD-10-CM

## 2018-07-06 MED ORDER — VALACYCLOVIR HCL 1 G PO TABS: 1000.0000 mg | ORAL_TABLET | ORAL | 0 refills | Status: DC | PRN

## 2018-07-06 NOTE — Patient Instructions (Addendum)
[facility]  7 a.m.-6:30 p.m., Monday 7 a.m.-5 p.m., Tuesday-Friday Schedule an appointment by calling [phone].  [facility] Schedule an appointment by calling [phone].   Cologuard is an easy to use noninvasive colon cancer screening test based on the latest advances in stool DNA science.   Colon cancer is 3rd most diagnosed cancer and 2nd leading cause of death in both men and women 51 years of age and older despite being one of the most preventable and treatable cancers if found early.  4 of out 5 people diagnosed with colon cancer have NO prior family history.  When caught EARLY 90% of colon cancer is curable.   You have agreed to do a Cologuard screening and have declined a colonoscopy in spite of being explained the risks and benefits of the colonoscopy in detail, including cancer and death. Please understand that this is test not as sensitive or specific as a colonoscopy and you are still recommended to get a colonoscopy.   If you are NOT medicare please call your insurance company and give them these items to see if they will cover it: 1) CPT code, [phone] 2) Provider is Probation officer 3) [employer] NPI [phone] 4) [place] Tax ID [phone]  Out-of-pocket cost for Cologuard can range from $0 - $649 so please call  You will receive a short call from [place] support center at [employer], when you receive a call they will say they are from [place],  to confirm your mailing address and give you more information.  When they calll you, it will appear on the caller ID as "[employer]" or in some cases only this number will appear, [phone].   [employer] will ship your collection kit directly to you. You will collect a single stool sample in the privacy of your own home, no special preparation required. You will return the kit via [place] pre-paid shipping or pick-up,  in the same box it arrived in. Then I will contact you to discuss your results after I receive them from the laboratory.   If you have any questions or concerns, Cologuard Customer Support Specialist are available 24 hours a day, 7 days a week at [phone] or go to [email or web address].    Aim for 7+ servings of fruits and vegetables daily  60+ fluid ounces of water or unsweet tea for healthy kidneys  Limit animal fats in diet for cholesterol and heart health - choose grass fed whenever available  Aim for low stress - take time to unwind and care for your mental health  Aim for 150 min of moderate intensity exercise weekly for heart health, and weights twice weekly for bone health  Aim for 7-9 hours of sleep daily

## 2018-07-07 ENCOUNTER — Encounter: Payer: Self-pay | Admitting: Adult Health

## 2018-07-08 ENCOUNTER — Other Ambulatory Visit: Payer: Self-pay | Admitting: Adult Health

## 2018-07-08 LAB — MICROALBUMIN / CREATININE URINE RATIO
Creatinine, Urine: 176 mg/dL (ref 20–275)
Microalb Creat Ratio: 5 mcg/mg creat (ref <30)
Microalb, Ur: 0.8 mg/dL

## 2018-07-08 LAB — CBC WITH DIFFERENTIAL/PLATELET
Basophils Absolute: 41 {cells}/uL (ref 0–200)
Basophils Relative: 0.7 %
Eosinophils Absolute: 112 {cells}/uL (ref 15–500)
Eosinophils Relative: 1.9 %
HCT: 38.5 % (ref 35.0–45.0)
Hemoglobin: 13 g/dL (ref 11.7–15.5)
Lymphs Abs: 1923 {cells}/uL (ref 850–3900)
MCH: 29.2 pg (ref 27.0–33.0)
MCHC: 33.8 g/dL (ref 32.0–36.0)
MCV: 86.5 fL (ref 80.0–100.0)
MPV: 10.2 fL (ref 7.5–12.5)
Monocytes Relative: 7 %
Neutro Abs: 3410 {cells}/uL (ref 1500–7800)
Neutrophils Relative %: 57.8 %
Platelets: 255 10*3/uL (ref 140–400)
RBC: 4.45 Million/uL (ref 3.80–5.10)
RDW: 12.3 % (ref 11.0–15.0)
Total Lymphocyte: 32.6 %
WBC mixed population: 413 {cells}/uL (ref 200–950)
WBC: 5.9 10*3/uL (ref 3.8–10.8)

## 2018-07-08 LAB — URINALYSIS W MICROSCOPIC + REFLEX CULTURE
Bilirubin Urine: NEGATIVE
GLUCOSE, UA: NEGATIVE
HGB URINE DIPSTICK: NEGATIVE
KETONES UR: NEGATIVE
NITRITES URINE, INITIAL: NEGATIVE
Protein, ur: NEGATIVE
Specific Gravity, Urine: 1.027 (ref 1.001–1.03)

## 2018-07-08 LAB — VITAMIN D 25 HYDROXY (VIT D DEFICIENCY, FRACTURES): Vit D, 25-Hydroxy: 41 ng/mL (ref 30–100)

## 2018-07-08 LAB — COMPLETE METABOLIC PANEL WITH GFR
AG Ratio: 2.1 (calc) (ref 1.0–2.5)
ALBUMIN MSPROF: 4.6 g/dL (ref 3.6–5.1)
ALKALINE PHOSPHATASE (APISO): 64 U/L (ref 33–130)
ALT: 19 U/L (ref 6–29)
AST: 21 U/L (ref 10–35)
BUN: 17 mg/dL (ref 7–25)
CO2: 29 mmol/L (ref 20–32)
CREATININE: 0.8 mg/dL (ref 0.50–1.05)
Calcium: 10.1 mg/dL (ref 8.6–10.4)
Chloride: 105 mmol/L (ref 98–110)
GFR, EST AFRICAN AMERICAN: 100 mL/min/{1.73_m2} (ref >=60)
GFR, EST NON AFRICAN AMERICAN: 86 mL/min/{1.73_m2} (ref >=60)
GLOBULIN: 2.2 g/dL (ref 1.9–3.7)
Glucose, Bld: 77 mg/dL (ref 65–99)
Potassium: 4 mmol/L (ref 3.5–5.3)
SODIUM: 140 mmol/L (ref 135–146)
TOTAL PROTEIN: 6.8 g/dL (ref 6.1–8.1)
Total Bilirubin: 0.7 mg/dL (ref 0.2–1.2)

## 2018-07-08 LAB — URINE CULTURE
MICRO NUMBER: 90822261
SPECIMEN QUALITY:: ADEQUATE

## 2018-07-08 LAB — LIPID PANEL
CHOL/HDL RATIO: 2.4 (calc) (ref <5.0)
Cholesterol: 164 mg/dL (ref <200)
HDL: 67 mg/dL (ref >50)
LDL CHOLESTEROL (CALC): 83 mg/dL
NON-HDL CHOLESTEROL (CALC): 97 mg/dL (ref <130)
Triglycerides: 56 mg/dL (ref <150)

## 2018-07-08 LAB — TSH: TSH: 0.93 m[IU]/L

## 2018-07-08 LAB — HEMOGLOBIN A1C
Hgb A1c MFr Bld: 5.3 % of total Hgb (ref <5.7)
MEAN PLASMA GLUCOSE: 105 (calc)
eAG (mmol/L): 5.8 (calc)

## 2018-07-08 LAB — CULTURE INDICATED

## 2018-07-08 LAB — VITAMIN B12: VITAMIN B 12: 789 pg/mL (ref 200–1100)

## 2018-07-08 MED ORDER — CEFUROXIME AXETIL 250 MG PO TABS: 250.0000 mg | ORAL_TABLET | Freq: Two times a day (BID) | ORAL | 0 refills | Status: DC

## 2018-07-12 ENCOUNTER — Other Ambulatory Visit: Payer: Self-pay | Admitting: Adult Health

## 2018-07-12 DIAGNOSIS — Z1231 Encounter for screening mammogram for malignant neoplasm of breast: Secondary | ICD-10-CM

## 2018-07-12 NOTE — Progress Notes (Signed)
Left message on voicemail.

## 2018-08-01 DIAGNOSIS — Z1211 Encounter for screening for malignant neoplasm of colon: Secondary | ICD-10-CM | POA: Diagnosis not present

## 2018-08-02 LAB — COLOGUARD: Cologuard: NEGATIVE

## 2018-08-11 ENCOUNTER — Encounter: Payer: Self-pay | Admitting: Adult Health

## 2018-08-12 ENCOUNTER — Ambulatory Visit
Admission: RE | Admit: 2018-08-12 | Discharge: 2018-08-12 | Disposition: A | Discharge status: Home / Self Care | Payer: 59 | Source: Ambulatory Visit | Attending: Adult Health | Admitting: Adult Health

## 2018-08-12 DIAGNOSIS — Z1231 Encounter for screening mammogram for malignant neoplasm of breast: Secondary | ICD-10-CM

## 2018-10-18 DIAGNOSIS — Z23 Encounter for immunization: Secondary | ICD-10-CM | POA: Diagnosis not present

## 2019-07-06 NOTE — Progress Notes (Signed)
Complete Physical  Assessment and Plan:  Routine general medical examination at a health care facility -     CBC with Differential/Platelet -     CMP/GFR -     TSH -     Lipid panel -     Urinalysis, Routine w reflex microscopic (not at [facility]) -     Microalbumin / creatinine urine ratio  HSV-1 and HSV 2  infection        - continue supplement        -     valACYclovir (VALTREX) 500 MG tablet; Take 2 tablets (1,000 mg total) by mouth 2 (two) times daily.  Depression -     TSH - in remission   Vitamin D deficiency  - continue medication  Dense breast tissue/fibroadenoma  - get 3D MGM annually   Screening cholesterol level -     Lipid panel  Screening, anemia -     Iron, TIBC, ferritin  Screening for blood or protein in urine -     Urinalysis, Routine w reflex microscopic (not at [facility])   Discussed med's effects and SE's. Screening labs and tests as requested with regular follow-up as recommended.  Future Appointments  Date Time Provider [place]  07/10/2019  9:00 AM Liane Comber, NP [facility] None  07/10/2020  9:00 AM Liane Comber, NP [facility] None     HPI 53 y.o. DAA female  presents for a complete physical. She has HSV-1 (herpes simplex virus 1) infection; Depression; Vitamin D deficiency; Medication management; and Dense breast tissue on their problem list.   Has custody of granddaughter, [name], will be in 5th grade this year.   Has history of HSV1 and HSV2, last outbreak 3 months ago, has 2-3/year.   BMI is Body mass index is 24.03 kg/m., she has been working on diet and exercise. She has quit sprite and is drinking water now.  1 cup coffee daily.  Wt Readings from Last 3 Encounters:  07/10/19 140 lb (63.5 kg)  07/06/18 141 lb 6.4 oz (64.1 kg)  06/22/17 131 lb (59.4 kg)   Her blood pressure has been controlled at home, today their BP is BP: 114/74 She does workout. She denies chest pain, shortness of breath, dizziness.   She is not on  cholesterol medication and denies myalgias. Her cholesterol is at goal. The cholesterol last visit was:   Lab Results  Component Value Date   CHOL 164 07/06/2018   HDL 67 07/06/2018   LDLCALC 83 07/06/2018   TRIG 56 07/06/2018   CHOLHDL 2.4 07/06/2018   Last A1C in the office was:  Lab Results  Component Value Date   HGBA1C 5.3 07/06/2018   Lab Results  Component Value Date   GFRNONAA 86 07/06/2018   Patient is on Vitamin D supplement: Lab Results  Component Value Date   VD25OH 41 07/06/2018     Current Medications:  Current Outpatient Medications on File Prior to Visit  Medication Sig  . BIOTIN PO Take 1 tablet by mouth daily.  Marland Kitchen LYSINE PO Take 1 tablet by mouth daily.  . Multiple Vitamin (MULTIVITAMIN) tablet Take 1 tablet by mouth daily.  . valACYclovir (VALTREX) 1000 MG tablet Take 1 tablet (1,000 mg total) by mouth as needed.  . cefUROXime (CEFTIN) 250 MG tablet Take 1 tablet (250 mg total) by mouth 2 (two) times daily.   No current facility-administered medications on file prior to visit.     Health Maintenance:   Immunization History  Administered Date(s) Administered  . Influenza Split 10/18/2018  . Influenza-Unspecified 10/18/2013, 10/17/2014, 08/27/2015  . Td 07/06/2018  . Tdap 02/08/2008   Tetanus: 2019 Pneumovax: N/A Prevnar 13: N/A Flu vaccine: 2019 at work Zostavax: N/A  Pap: 04/2013, had TAH 06/2013 - none further needed per GYN US Pelvis: 05/2013 3DMGM: 07/2018, dense breasts cat C, breast center, patient will schedule upcoming DEXA: N/A Colonoscopy: never Cologuard: 07/2018 negative EGD: N/A  DEE: Dr. Sabra Heck glasses, 05/2019 [facility]ist: [facility], last visit 2020, goes q44m  Routine labs  RPR negative 05/2014 HIV negative 05/2014  Medical History:  Past Medical History:  Diagnosis Date  . Depression   . HSV-1 (herpes simplex virus 1) infection   . Vitamin D deficiency   Allergies No Active Allergies  SURGICAL  HISTORY She  has a past surgical history that includes Tonsillectomy and adenoidectomy; Novasure ablation; Abdominal hysterectomy (2014); and Breast biopsy (Right). FAMILY HISTORY Her family history includes Breast cancer in her maternal grandmother; Leukemia in her paternal aunt. SOCIAL HISTORY She  reports that she has never smoked. She has never used smokeless tobacco. She reports that she does not drink alcohol or use drugs.   Review of Systems  Constitutional: Negative.  Negative for malaise/fatigue and weight loss.  HENT: Negative.  Negative for hearing loss and tinnitus.   Eyes: Negative.  Negative for blurred vision and double vision.  Respiratory: Negative.  Negative for cough, sputum production, shortness of breath and wheezing.   Cardiovascular: Negative.  Negative for chest pain, palpitations, orthopnea, claudication, leg swelling and PND.  Gastrointestinal: Negative.  Negative for abdominal pain, blood in stool, constipation, diarrhea, heartburn, melena, nausea and vomiting.  Genitourinary: Negative.   Musculoskeletal: Negative for falls, joint pain and myalgias.  Skin: Negative.  Negative for rash.  Neurological: Negative.  Negative for dizziness, tingling, sensory change, weakness and headaches.  Endo/Heme/Allergies: Negative.  Negative for polydipsia.  Psychiatric/Behavioral: Negative.  Negative for depression, memory loss, substance abuse and suicidal ideas. The patient is not nervous/anxious and does not have insomnia.   All other systems reviewed and are negative.  Physical Exam: Estimated body mass index is 24.03 kg/m as calculated from the following:   Height as of this encounter: 5\' 4"  (1.626 m).   Weight as of this encounter: 140 lb (63.5 kg). BP 114/74   Pulse 69   Temp 97.9 F (36.6 C)   Ht 5\' 4"  (1.626 m)   Wt 140 lb (63.5 kg)   LMP 05/31/2013   SpO2 98%   BMI 24.03 kg/m  General Appearance: Well nourished, in no apparent distress. Eyes: PERRLA, EOMs,  conjunctiva no swelling or erythema, normal fundi and vessels. Sinuses: No Frontal/maxillary tenderness ENT/Mouth: Ext aud canals clear, normal light reflex with TMs without erythema, bulging.  Good dentition. No erythema, swelling, or exudate on post pharynx. Tonsils not swollen or erythematous. Hearing normal.  Neck: Supple, thyroid normal. No bruits Respiratory: Respiratory effort normal, BS equal bilaterally without rales, rhonchi, wheezing or stridor. Cardio: RRR without murmurs, rubs or gallops. Brisk peripheral pulses without edema.  Chest: symmetric, with normal excursions and percussion. Breasts: Symmetrical without palpable lumps, no discharge from nipples, fibrous dense texture throughout Abdomen: Soft, +BS. Non tender, no guarding, rebound, hernias, masses, or organomegaly. .  Lymphatics: Non tender without lymphadenopathy.  Genitourinary: defer Musculoskeletal: Full ROM all peripheral extremities,5/5 strength, and normal gait. Skin: Tattoo on left lateral ankle. Warm, dry without rashes, lesions, ecchymosis.  Neuro: Cranial nerves intact, reflexes equal bilaterally. Normal muscle  tone, no cerebellar symptoms. Sensation intact.  Psych: Awake and oriented X 3, normal affect, Insight and Judgment appropriate.   EKG:  WNL, no ST changes    Gorden Harms Lometa Riggin 8:50 AM

## 2019-07-10 ENCOUNTER — Ambulatory Visit (INDEPENDENT_AMBULATORY_CARE_PROVIDER_SITE_OTHER): Payer: 59 | Admitting: Adult Health

## 2019-07-10 ENCOUNTER — Other Ambulatory Visit: Payer: Self-pay

## 2019-07-10 ENCOUNTER — Other Ambulatory Visit: Payer: Self-pay | Admitting: Adult Health

## 2019-07-10 ENCOUNTER — Encounter: Payer: Self-pay | Admitting: Adult Health

## 2019-07-10 VITALS — BP 114/74 | HR 69 | Temp 97.9°F | Ht 64.0 in | Wt 140.0 lb

## 2019-07-10 DIAGNOSIS — R922 Inconclusive mammogram: Secondary | ICD-10-CM

## 2019-07-10 DIAGNOSIS — E559 Vitamin D deficiency, unspecified: Secondary | ICD-10-CM

## 2019-07-10 DIAGNOSIS — Z136 Encounter for screening for cardiovascular disorders: Secondary | ICD-10-CM

## 2019-07-10 DIAGNOSIS — I10 Essential (primary) hypertension: Secondary | ICD-10-CM

## 2019-07-10 DIAGNOSIS — Z1329 Encounter for screening for other suspected endocrine disorder: Secondary | ICD-10-CM

## 2019-07-10 DIAGNOSIS — D649 Anemia, unspecified: Secondary | ICD-10-CM

## 2019-07-10 DIAGNOSIS — Z23 Encounter for immunization: Secondary | ICD-10-CM

## 2019-07-10 DIAGNOSIS — Z6824 Body mass index (BMI) 24.0-24.9, adult: Secondary | ICD-10-CM

## 2019-07-10 DIAGNOSIS — Z131 Encounter for screening for diabetes mellitus: Secondary | ICD-10-CM

## 2019-07-10 DIAGNOSIS — Z13220 Encounter for screening for lipoid disorders: Secondary | ICD-10-CM

## 2019-07-10 DIAGNOSIS — F3342 Major depressive disorder, recurrent, in full remission: Secondary | ICD-10-CM

## 2019-07-10 DIAGNOSIS — Z79899 Other long term (current) drug therapy: Secondary | ICD-10-CM

## 2019-07-10 DIAGNOSIS — Z0000 Encounter for general adult medical examination without abnormal findings: Secondary | ICD-10-CM | POA: Diagnosis not present

## 2019-07-10 DIAGNOSIS — B009 Herpesviral infection, unspecified: Secondary | ICD-10-CM

## 2019-07-10 MED ORDER — ZOSTER VAC RECOMB ADJUVANTED 50 MCG/0.5ML IM SUSR: INTRAMUSCULAR | 1 refills | Status: DC

## 2019-07-10 NOTE — Patient Instructions (Addendum)
Ms. Wantz , Thank you for taking time to come for your Annual Wellness Visit. I appreciate your ongoing commitment to your health goals. Please review the following plan we discussed and let me know if I can assist you in the future.   These are the goals we discussed: Goals    . DIET - EAT MORE FRUITS AND VEGETABLES    . Exercise 150 min/wk Moderate Activity       This is a list of the screening recommended for you and due dates:  Health Maintenance  Topic Date Due  . Flu Shot  07/29/2019  . Mammogram  08/12/2020  . Cologuard (Stool DNA test)  08/02/2021  . Tetanus Vaccine  07/06/2028  . HIV Screening  Completed    Ask your insurance about the shingrix vaccine -    Know what a healthy weight is for you (roughly BMI <25) and aim to maintain this  Aim for 7+ servings of fruits and vegetables daily  65-80+ fluid ounces of water or unsweet tea for healthy kidneys  Limit to max 1 drink of alcohol per day; avoid smoking/tobacco  Limit animal fats in diet for cholesterol and heart health - choose grass fed whenever available  Avoid highly processed foods, and foods high in saturated/trans fats  Aim for low stress - take time to unwind and care for your mental health  Aim for 150 min of moderate intensity exercise weekly for heart health, and weights twice weekly for bone health  Aim for 7-9 hours of sleep daily    Zoster Vaccine, Recombinant injection (Shingrix) What is this medicine? ZOSTER VACCINE (ZOS ter vak SEEN) is used to prevent shingles in adults 52 years old and over. This vaccine is not used to treat shingles or nerve pain from shingles. This medicine may be used for other purposes; ask your health care provider or pharmacist if you have questions. COMMON BRAND NAME(S): [facility] What should I tell my health care provider before I take this medicine? They need to know if you have any of these conditions:  blood disorders or disease  cancer like leukemia or  lymphoma  immune system problems or therapy  an unusual or allergic reaction to vaccines, other medications, foods, dyes, or preservatives  pregnant or trying to get pregnant  breast-feeding How should I use this medicine? This vaccine is for injection in a muscle. It is given by a health care professional. Talk to your pediatrician regarding the use of this medicine in children. This medicine is not approved for use in children. Overdosage: If you think you have taken too much of this medicine contact a poison control center or emergency room at once. NOTE: This medicine is only for you. Do not share this medicine with others. What if I miss a dose? Keep appointments for follow-up (booster) doses as directed. It is important not to miss your dose. Call your doctor or health care professional if you are unable to keep an appointment. What may interact with this medicine?  medicines that suppress your immune system  medicines to treat cancer  steroid medicines like prednisone or cortisone This list may not describe all possible interactions. Give your health care provider a list of all the medicines, herbs, non-prescription drugs, or dietary supplements you use. Also tell them if you smoke, drink alcohol, or use illegal drugs. Some items may interact with your medicine. What should I watch for while using this medicine? Visit your doctor for regular check ups. This  vaccine, like all vaccines, may not fully protect everyone. What side effects may I notice from receiving this medicine? Side effects that you should report to your doctor or health care professional as soon as possible:  allergic reactions like skin rash, itching or hives, swelling of the face, lips, or tongue  breathing problems Side effects that usually do not require medical attention (report these to your doctor or health care professional if they continue or are bothersome):  chills  headache  fever  nausea,  vomiting  redness, warmth, pain, swelling or itching at site where injected  tiredness This list may not describe all possible side effects. Call your doctor for medical advice about side effects. You may report side effects to FDA at 1-800-FDA-1088. Where should I keep my medicine? This vaccine is only given in a clinic, pharmacy, doctor's office, or other health care setting and will not be stored at home. NOTE: This sheet is a summary. It may not cover all possible information. If you have questions about this medicine, talk to your doctor, pharmacist, or health care provider.  2020 Elsevier/Gold Standard (2017-07-26 13:20:30)

## 2019-07-11 LAB — HEMOGLOBIN A1C
Hgb A1c MFr Bld: 5.2 % of total Hgb (ref <5.7)
Mean Plasma Glucose: 103 (calc)
eAG (mmol/L): 5.7 (calc)

## 2019-07-11 LAB — CBC WITH DIFFERENTIAL/PLATELET
Absolute Monocytes: 367 cells/uL (ref 200–950)
Basophils Absolute: 49 cells/uL (ref 0–200)
Basophils Relative: 0.9 %
Eosinophils Absolute: 178 cells/uL (ref 15–500)
Eosinophils Relative: 3.3 %
HCT: 42.2 % (ref 35.0–45.0)
Hemoglobin: 13.9 g/dL (ref 11.7–15.5)
Lymphs Abs: 1939 cells/uL (ref 850–3900)
MCH: 29.3 pg (ref 27.0–33.0)
MCHC: 32.9 g/dL (ref 32.0–36.0)
MCV: 89 fL (ref 80.0–100.0)
MPV: 10.2 fL (ref 7.5–12.5)
Monocytes Relative: 6.8 %
Neutro Abs: 2867 cells/uL (ref 1500–7800)
Neutrophils Relative %: 53.1 %
Platelets: 273 10*3/uL (ref 140–400)
RBC: 4.74 10*6/uL (ref 3.80–5.10)
RDW: 12.3 % (ref 11.0–15.0)
Total Lymphocyte: 35.9 %
WBC: 5.4 10*3/uL (ref 3.8–10.8)

## 2019-07-11 LAB — COMPLETE METABOLIC PANEL WITH GFR
AG Ratio: 2.1 (calc) (ref 1.0–2.5)
ALT: 12 U/L (ref 6–29)
AST: 13 U/L (ref 10–35)
Albumin: 4.5 g/dL (ref 3.6–5.1)
Alkaline phosphatase (APISO): 63 U/L (ref 37–153)
BUN: 25 mg/dL (ref 7–25)
CO2: 28 mmol/L (ref 20–32)
Calcium: 10.6 mg/dL — ABNORMAL HIGH (ref 8.6–10.4)
Chloride: 108 mmol/L (ref 98–110)
Creat: 0.76 mg/dL (ref 0.50–1.05)
GFR, Est African American: 105 mL/min/{1.73_m2} (ref >=60)
GFR, Est Non African American: 91 mL/min/{1.73_m2} (ref >=60)
Globulin: 2.1 g/dL (calc) (ref 1.9–3.7)
Glucose, Bld: 87 mg/dL (ref 65–99)
Potassium: 4.4 mmol/L (ref 3.5–5.3)
Sodium: 142 mmol/L (ref 135–146)
Total Bilirubin: 0.5 mg/dL (ref 0.2–1.2)
Total Protein: 6.6 g/dL (ref 6.1–8.1)

## 2019-07-11 LAB — LIPID PANEL
Cholesterol: 164 mg/dL (ref <200)
HDL: 72 mg/dL (ref >=50)
LDL Cholesterol (Calc): 79 mg/dL (calc)
Non-HDL Cholesterol (Calc): 92 mg/dL (calc) (ref <130)
Total CHOL/HDL Ratio: 2.3 (calc) (ref <5.0)
Triglycerides: 51 mg/dL (ref <150)

## 2019-07-11 LAB — IRON,TIBC AND FERRITIN PANEL
%SAT: 26 % (calc) (ref 16–45)
Ferritin: 32 ng/mL (ref 16–232)
Iron: 87 ug/dL (ref 45–160)
TIBC: 335 mcg/dL (calc) (ref 250–450)

## 2019-07-11 LAB — URINALYSIS, ROUTINE W REFLEX MICROSCOPIC
Bilirubin Urine: NEGATIVE
Glucose, UA: NEGATIVE
Hgb urine dipstick: NEGATIVE
Ketones, ur: NEGATIVE
Leukocytes,Ua: NEGATIVE
Nitrite: NEGATIVE
Protein, ur: NEGATIVE
Specific Gravity, Urine: 1.024 (ref 1.001–1.03)
pH: <=5 (ref 5.0–8.0)

## 2019-07-11 LAB — TSH: TSH: 0.8 mIU/L

## 2019-07-11 LAB — VITAMIN D 25 HYDROXY (VIT D DEFICIENCY, FRACTURES): Vit D, 25-Hydroxy: 64 ng/mL (ref 30–100)

## 2019-09-01 ENCOUNTER — Other Ambulatory Visit: Payer: Self-pay | Admitting: Adult Health

## 2019-09-01 DIAGNOSIS — Z1231 Encounter for screening mammogram for malignant neoplasm of breast: Secondary | ICD-10-CM

## 2019-10-19 ENCOUNTER — Ambulatory Visit
Admission: RE | Admit: 2019-10-19 | Discharge: 2019-10-19 | Disposition: A | Discharge status: Home / Self Care | Payer: 59 | Source: Ambulatory Visit | Attending: Adult Health | Admitting: Adult Health

## 2019-10-19 ENCOUNTER — Other Ambulatory Visit: Payer: Self-pay

## 2019-10-19 DIAGNOSIS — Z1231 Encounter for screening mammogram for malignant neoplasm of breast: Secondary | ICD-10-CM

## 2019-10-23 ENCOUNTER — Other Ambulatory Visit: Payer: Self-pay | Admitting: Adult Health

## 2019-10-23 DIAGNOSIS — R928 Other abnormal and inconclusive findings on diagnostic imaging of breast: Secondary | ICD-10-CM

## 2019-12-07 ENCOUNTER — Ambulatory Visit
Admission: RE | Admit: 2019-12-07 | Discharge: 2019-12-07 | Disposition: A | Discharge status: Home / Self Care | Payer: 59 | Source: Ambulatory Visit | Attending: Adult Health | Admitting: Adult Health

## 2019-12-07 ENCOUNTER — Ambulatory Visit: Payer: 59

## 2019-12-07 ENCOUNTER — Other Ambulatory Visit: Payer: Self-pay

## 2019-12-07 DIAGNOSIS — R928 Other abnormal and inconclusive findings on diagnostic imaging of breast: Secondary | ICD-10-CM

## 2020-06-10 ENCOUNTER — Other Ambulatory Visit: Payer: Self-pay | Admitting: Adult Health

## 2020-06-10 DIAGNOSIS — Z1231 Encounter for screening mammogram for malignant neoplasm of breast: Secondary | ICD-10-CM

## 2020-07-09 NOTE — Progress Notes (Signed)
Complete Physical  Assessment and Plan:  Routine general medical examination at a health care facility -     CBC with Differential/Platelet -     CMP/GFR -     Urinalysis, Routine w reflex microscopic (not at [facility])  HSV-1 and HSV 2  infection        - continue supplement        -     valACYclovir (VALTREX) 500 MG tablet; Take 2 tablets (1,000 mg total) by mouth daily as needed  Depression - in remission   Vitamin D deficiency  - continue medication  Dense breast tissue/fibroadenoma  - get 3D MGM annually   Screening cholesterol level -     Lipid panel  Screening for blood or protein in urine -     Urinalysis, Routine w reflex microscopic (not at [facility])   Discussed med's effects and SE's. Screening labs and tests as requested with regular follow-up as recommended.  Future Appointments  Date Time Provider [place]  07/10/2020  9:00 AM Liane Comber, NP [facility] None  12/09/2020  9:00 AM [facility] MM 2 [facility]  07/10/2021  9:00 AM Liane Comber, NP [facility] None     HPI 54 y.o. DAA female  presents for a complete physical. She has HSV-1 (herpes simplex virus 1) infection; Depression; Vitamin D deficiency; Medication management; and Dense breast tissue on their problem list.   Has custody of granddaughter, [name], will be in 6th grade this year. Has new home and has been renovating, has new puppy 3 weeks.  Divorced, single and happy. Works remotely in [employer] under[employer] and loving working from home.   No concerns.   Has history of HSV1 and HSV2, last outbreak 3 months ago, has 2-3/year, takes   BMI is Body mass index is 26.15 kg/m., she has been working on diet and exercise.  2 cup coffee daily.  Admits back to drinking sprite, 1 daily, admits to not much water.  Admits to very poor fruit/veggies, eats mostly meat and potatoes Sleeps well, 8+.  Exercise- very active with new puppy daily, hour+ Wt Readings from Last 3 Encounters:  07/10/20 150 lb  (68 kg)  07/10/19 140 lb (63.5 kg)  07/06/18 141 lb 6.4 oz (64.1 kg)   Her blood pressure has been controlled at home, today their BP is BP: 120/80 She does workout. She denies chest pain, shortness of breath, dizziness.   She is not on cholesterol medication and denies myalgias. Her cholesterol is at goal. The cholesterol last visit was:   Lab Results  Component Value Date   CHOL 164 07/10/2019   HDL 72 07/10/2019   LDLCALC 79 07/10/2019   TRIG 51 07/10/2019   CHOLHDL 2.3 07/10/2019   Last A1C in the office was:  Lab Results  Component Value Date   HGBA1C 5.2 07/10/2019   Lab Results  Component Value Date   GFRNONAA 91 07/10/2019   Patient is on Vitamin D supplement, taking 5000 IU daily: Lab Results  Component Value Date   VD25OH 64 07/10/2019     Current Medications:  Current Outpatient Medications on File Prior to Visit  Medication Sig   BIOTIN PO Take 1 tablet by mouth daily.   CHOLECALCIFEROL PO Take by mouth. 5000 IU daily    LYSINE PO Take 1 tablet by mouth daily.   Multiple Vitamin (MULTIVITAMIN) tablet Take 1 tablet by mouth daily.   valACYclovir (VALTREX) 1000 MG tablet Take 1 tablet (1,000 mg total) by mouth as  needed.   Zoster Vaccine Adjuvanted [facility]) injection Give 0.5 mg IM x 1 at month 0; Repeat in 2-6 months for total of 2 doses.   No current facility-administered medications on file prior to visit.    Health Maintenance:   Immunization History  Administered Date(s) Administered   Influenza Split 10/18/2018   Influenza-Unspecified 10/18/2013, 10/17/2014, 08/27/2015   Td 07/06/2018   Tdap 02/08/2008   Zoster Recombinat (Shingrix) 07/12/2019   Tetanus: 2019 Pneumovax: N/A Prevnar 13: N/A Flu vaccine: 2020 at work Shingrix: 2/2, 06/2019 Covid 19: declines   Pap: 04/2013, had TAH 06/2013 - none further needed per GYN US Pelvis: 05/2013  3DMGM: 11/2019, has scheduled 12/09/2020 DEXA: N/A  Colonoscopy: never Cologuard:  07/2018 negative EGD: N/A  DEE: Dr. Sabra Heck, glasses, 05/2019 [facility], last visit 2021, goes q61m  Routine labs  RPR negative 05/2014 HIV negative 05/2014  Medical History:  Past Medical History:  Diagnosis Date   Depression    HSV-1 (herpes simplex virus 1) infection    Vitamin D deficiency   Allergies No Active Allergies  SURGICAL HISTORY She  has a past surgical history that includes Tonsillectomy and adenoidectomy; Novasure ablation; Abdominal hysterectomy (2014); and Breast biopsy (Right). FAMILY HISTORY Her family history includes Breast cancer in her maternal grandmother; Leukemia in her paternal aunt. SOCIAL HISTORY She  reports that she has never smoked. She has never used smokeless tobacco. She reports that she does not drink alcohol and does not use drugs.   Review of Systems  Constitutional: Negative.  Negative for malaise/fatigue and weight loss.  HENT: Negative.  Negative for hearing loss and tinnitus.   Eyes: Negative.  Negative for blurred vision and double vision.  Respiratory: Negative.  Negative for cough, sputum production, shortness of breath and wheezing.   Cardiovascular: Negative.  Negative for chest pain, palpitations, orthopnea, claudication, leg swelling and PND.  Gastrointestinal: Negative.  Negative for abdominal pain, blood in stool, constipation, diarrhea, heartburn, melena, nausea and vomiting.  Genitourinary: Negative.   Musculoskeletal: Negative for falls, joint pain and myalgias.  Skin: Negative.  Negative for rash.  Neurological: Negative.  Negative for dizziness, tingling, sensory change, weakness and headaches.  Endo/Heme/Allergies: Negative.  Negative for polydipsia.  Psychiatric/Behavioral: Negative.  Negative for depression, memory loss, substance abuse and suicidal ideas. The patient is not nervous/anxious and does not have insomnia.   All other systems reviewed and are negative.  Physical Exam: Estimated body  mass index is 26.15 kg/m as calculated from the following:   Height as of this encounter: 5' 3.5" (1.613 m).   Weight as of this encounter: 150 lb (68 kg). BP 120/80    Pulse 63    Temp 97.7 F (36.5 C)    Ht 5' 3.5" (1.613 m)    Wt 150 lb (68 kg)    LMP 05/31/2013    SpO2 99%    BMI 26.15 kg/m  General Appearance: Well nourished, in no apparent distress. Eyes: PERRLA, EOMs, conjunctiva no swelling or erythema Sinuses: No Frontal/maxillary tenderness ENT/Mouth: Ext aud canals clear, normal light reflex with TMs without erythema, bulging.  Good dentition. No erythema, swelling, or exudate on post pharynx. Tonsils not swollen or erythematous. Hearing normal.  Neck: Supple, thyroid normal. No bruits Respiratory: Respiratory effort normal, BS equal bilaterally without rales, rhonchi, wheezing or stridor. Cardio: RRR without murmurs, rubs or gallops. Brisk peripheral pulses without edema.  Chest: symmetric, with normal excursions and percussion. Breasts: Symmetrical without palpable lumps, no discharge from nipples,  fibrous dense texture throughout Abdomen: Soft, +BS. Non tender, no guarding, rebound, hernias, masses, or organomegaly. .  Lymphatics: Non tender without lymphadenopathy.  Genitourinary: defer Musculoskeletal: Full ROM all peripheral extremities,5/5 strength, and normal gait. Skin: Tattoo on left lateral ankle. Warm, dry without rashes, lesions, ecchymosis.  Neuro: Cranial nerves intact, reflexes equal bilaterally. Normal muscle tone, no cerebellar symptoms. Sensation intact.  Psych: Awake and oriented X 3, normal affect, Insight and Judgment appropriate.   EKG:  WNL 2020 reviewed, low risk, defer   Julie Ribas, NP 8:57 AM [facility]

## 2020-07-10 ENCOUNTER — Ambulatory Visit (INDEPENDENT_AMBULATORY_CARE_PROVIDER_SITE_OTHER): Payer: BC Managed Care – PPO | Admitting: Adult Health

## 2020-07-10 ENCOUNTER — Other Ambulatory Visit: Payer: Self-pay

## 2020-07-10 ENCOUNTER — Encounter: Payer: Self-pay | Admitting: Adult Health

## 2020-07-10 VITALS — BP 120/80 | HR 63 | Temp 97.7°F | Ht 63.5 in | Wt 150.0 lb

## 2020-07-10 DIAGNOSIS — E663 Overweight: Secondary | ICD-10-CM | POA: Insufficient documentation

## 2020-07-10 DIAGNOSIS — F3342 Major depressive disorder, recurrent, in full remission: Secondary | ICD-10-CM

## 2020-07-10 DIAGNOSIS — Z79899 Other long term (current) drug therapy: Secondary | ICD-10-CM

## 2020-07-10 DIAGNOSIS — Z1389 Encounter for screening for other disorder: Secondary | ICD-10-CM

## 2020-07-10 DIAGNOSIS — Z0000 Encounter for general adult medical examination without abnormal findings: Secondary | ICD-10-CM | POA: Diagnosis not present

## 2020-07-10 DIAGNOSIS — Z13220 Encounter for screening for lipoid disorders: Secondary | ICD-10-CM | POA: Diagnosis not present

## 2020-07-10 DIAGNOSIS — Z131 Encounter for screening for diabetes mellitus: Secondary | ICD-10-CM

## 2020-07-10 DIAGNOSIS — Z1329 Encounter for screening for other suspected endocrine disorder: Secondary | ICD-10-CM

## 2020-07-10 DIAGNOSIS — E559 Vitamin D deficiency, unspecified: Secondary | ICD-10-CM | POA: Diagnosis not present

## 2020-07-10 DIAGNOSIS — R922 Inconclusive mammogram: Secondary | ICD-10-CM

## 2020-07-10 DIAGNOSIS — B009 Herpesviral infection, unspecified: Secondary | ICD-10-CM

## 2020-07-10 MED ORDER — VALACYCLOVIR HCL 1 G PO TABS: 1000.0000 mg | ORAL_TABLET | ORAL | 0 refills | Status: DC | PRN

## 2020-07-10 NOTE — Patient Instructions (Signed)
  Ms. Jeancharles , Thank you for taking time to come for your Annual Wellness Visit. I appreciate your ongoing commitment to your health goals. Please review the following plan we discussed and let me know if I can assist you in the future.   These are the goals we discussed: Goals    . DIET - EAT MORE FRUITS AND VEGETABLES     Work up to 3 servings of fruit/veggie daily to start    . DIET - INCREASE WATER INTAKE     65+ fluid ounces, avoid daily soda    . Exercise 150 min/wk Moderate Activity       This is a list of the screening recommended for you and due dates:  Health Maintenance  Topic Date Due  . COVID-19 Vaccine (1) 07/26/2020*  . Flu Shot  07/28/2020  . Cologuard (Stool DNA test)  08/02/2021  . Mammogram  12/06/2021  . Tetanus Vaccine  07/06/2028  .  Hepatitis C: One time screening is recommended by Center for Disease Control  (CDC) for  adults born from [birth year] through 1964.   Completed  . HIV Screening  Completed  *Topic was postponed. The date shown is not the original due date.      Know what a healthy weight is for you (roughly BMI <25) and aim to maintain this  Aim for 7+ servings of fruits and vegetables daily  65-80+ fluid ounces of water or unsweet tea for healthy kidneys  Limit to max 1 drink of alcohol per day; avoid smoking/tobacco  Limit animal fats in diet for cholesterol and heart health - choose grass fed whenever available  Avoid highly processed foods, and foods high in saturated/trans fats  Aim for low stress - take time to unwind and care for your mental health  Aim for 150 min of moderate intensity exercise weekly for heart health, and weights twice weekly for bone health  Aim for 7-9 hours of sleep daily      A great goal to work towards is aiming to get in a serving daily of some of the most nutritionally dense foods - G- BOMBS daily

## 2020-07-11 LAB — LIPID PANEL
Cholesterol: 166 mg/dL (ref <200)
HDL: 72 mg/dL (ref >=50)
LDL Cholesterol (Calc): 80 mg/dL (calc)
Non-HDL Cholesterol (Calc): 94 mg/dL (calc) (ref <130)
Total CHOL/HDL Ratio: 2.3 (calc) (ref <5.0)
Triglycerides: 56 mg/dL (ref <150)

## 2020-07-11 LAB — URINALYSIS, ROUTINE W REFLEX MICROSCOPIC
Bilirubin Urine: NEGATIVE
Glucose, UA: NEGATIVE
Hgb urine dipstick: NEGATIVE
Ketones, ur: NEGATIVE
Leukocytes,Ua: NEGATIVE
Nitrite: NEGATIVE
Protein, ur: NEGATIVE
Specific Gravity, Urine: 1.022 (ref 1.001–1.03)
pH: 6 (ref 5.0–8.0)

## 2020-07-11 LAB — CBC WITH DIFFERENTIAL/PLATELET
Absolute Monocytes: 383 cells/uL (ref 200–950)
Basophils Absolute: 38 cells/uL (ref 0–200)
Basophils Relative: 0.7 %
Eosinophils Absolute: 103 cells/uL (ref 15–500)
Eosinophils Relative: 1.9 %
HCT: 41.6 % (ref 35.0–45.0)
Hemoglobin: 13.4 g/dL (ref 11.7–15.5)
Lymphs Abs: 1733 cells/uL (ref 850–3900)
MCH: 28.9 pg (ref 27.0–33.0)
MCHC: 32.2 g/dL (ref 32.0–36.0)
MCV: 89.8 fL (ref 80.0–100.0)
MPV: 10.3 fL (ref 7.5–12.5)
Monocytes Relative: 7.1 %
Neutro Abs: 3143 cells/uL (ref 1500–7800)
Neutrophils Relative %: 58.2 %
Platelets: 261 10*3/uL (ref 140–400)
RBC: 4.63 10*6/uL (ref 3.80–5.10)
RDW: 12.1 % (ref 11.0–15.0)
Total Lymphocyte: 32.1 %
WBC: 5.4 10*3/uL (ref 3.8–10.8)

## 2020-07-11 LAB — COMPLETE METABOLIC PANEL WITH GFR
AG Ratio: 2.2 (calc) (ref 1.0–2.5)
ALT: 15 U/L (ref 6–29)
AST: 16 U/L (ref 10–35)
Albumin: 4.4 g/dL (ref 3.6–5.1)
Alkaline phosphatase (APISO): 67 U/L (ref 37–153)
BUN: 19 mg/dL (ref 7–25)
CO2: 28 mmol/L (ref 20–32)
Calcium: 10.1 mg/dL (ref 8.6–10.4)
Chloride: 107 mmol/L (ref 98–110)
Creat: 0.78 mg/dL (ref 0.50–1.05)
GFR, Est African American: 101 mL/min/{1.73_m2} (ref >=60)
GFR, Est Non African American: 87 mL/min/{1.73_m2} (ref >=60)
Globulin: 2 g/dL (calc) (ref 1.9–3.7)
Glucose, Bld: 90 mg/dL (ref 65–99)
Potassium: 4.3 mmol/L (ref 3.5–5.3)
Sodium: 142 mmol/L (ref 135–146)
Total Bilirubin: 0.6 mg/dL (ref 0.2–1.2)
Total Protein: 6.4 g/dL (ref 6.1–8.1)

## 2020-07-11 LAB — MAGNESIUM: Magnesium: 2 mg/dL (ref 1.5–2.5)

## 2020-07-11 LAB — VITAMIN D 25 HYDROXY (VIT D DEFICIENCY, FRACTURES): Vit D, 25-Hydroxy: 31 ng/mL (ref 30–100)

## 2020-09-08 ENCOUNTER — Ambulatory Visit
Admission: EM | Admit: 2020-09-08 | Discharge: 2020-09-08 | Disposition: A | Discharge status: Home / Self Care | Payer: BC Managed Care – PPO | Attending: Emergency Medicine | Admitting: Emergency Medicine

## 2020-09-08 ENCOUNTER — Encounter: Payer: Self-pay | Admitting: Emergency Medicine

## 2020-09-08 ENCOUNTER — Other Ambulatory Visit: Payer: Self-pay

## 2020-09-08 ENCOUNTER — Ambulatory Visit (INDEPENDENT_AMBULATORY_CARE_PROVIDER_SITE_OTHER): Payer: BC Managed Care – PPO

## 2020-09-08 DIAGNOSIS — W19XXXA Unspecified fall, initial encounter: Secondary | ICD-10-CM | POA: Diagnosis not present

## 2020-09-08 DIAGNOSIS — S52502A Unspecified fracture of the lower end of left radius, initial encounter for closed fracture: Secondary | ICD-10-CM | POA: Diagnosis not present

## 2020-09-08 DIAGNOSIS — M25432 Effusion, left wrist: Secondary | ICD-10-CM | POA: Diagnosis not present

## 2020-09-08 DIAGNOSIS — M25532 Pain in left wrist: Secondary | ICD-10-CM

## 2020-09-08 DIAGNOSIS — S52615A Nondisplaced fracture of left ulna styloid process, initial encounter for closed fracture: Secondary | ICD-10-CM | POA: Diagnosis not present

## 2020-09-08 DIAGNOSIS — S6992XA Unspecified injury of left wrist, hand and finger(s), initial encounter: Secondary | ICD-10-CM | POA: Diagnosis not present

## 2020-09-08 DIAGNOSIS — S52122A Displaced fracture of head of left radius, initial encounter for closed fracture: Secondary | ICD-10-CM

## 2020-09-08 DIAGNOSIS — S52522A Torus fracture of lower end of left radius, initial encounter for closed fracture: Secondary | ICD-10-CM | POA: Diagnosis not present

## 2020-09-08 MED ORDER — ONDANSETRON 4 MG PO TBDP
4.0000 mg | ORAL_TABLET | Freq: Once | ORAL | Status: AC
  Administered 2020-09-08: 4 mg via ORAL

## 2020-09-08 NOTE — ED Triage Notes (Signed)
Patient states that she fell backwards onto her left wrist, pain and deformity noted.

## 2020-09-08 NOTE — ED Provider Notes (Signed)
[facility]    CSN: [id number] Arrival date & time: 09/08/20  1029      History   Chief Complaint Chief Complaint  Patient presents with  . Wrist Pain    HPI Julie Cochran is a 54 y.o. female.   Presented to the urgent care with a complaint of wrist pain that occurred today.  Reports she fell backward trying to pull dead trees in her backyard.  She localized the pain to the left wrist.  She describes the pain as constant and achy.  She has tried OTC medications without relief.  Her symptoms are made worse with ROM.  She denies similar symptoms in the past.  Denies chills, fever, nausea, vomiting, diarrhea   The history is provided by the patient. No language interpreter was used.  Wrist Pain    Past Medical History:  Diagnosis Date  . Depression   . HSV-1 (herpes simplex virus 1) infection   . Vitamin D deficiency     Patient Active Problem List   Diagnosis Date Noted  . Overweight (BMI 25.0-29.9) 07/10/2020  . Medication management 06/18/2016  . Dense breast tissue 06/18/2016  . HSV-1 (herpes simplex virus 1) infection   . Depression   . Vitamin D deficiency     Past Surgical History:  Procedure Laterality Date  . ABDOMINAL HYSTERECTOMY  2014   Ronald Neal,MD. 7/14  . BREAST BIOPSY Right   . NOVASURE ABLATION    . TONSILLECTOMY AND ADENOIDECTOMY      OB History   No obstetric history on file.      Home Medications    Prior to Admission medications   Medication Sig Start Date End Date Taking? Authorizing Provider  BIOTIN PO Take 1 tablet by mouth daily.    [provider]  CHOLECALCIFEROL PO Take by mouth. 5000 IU daily     [provider]  LYSINE PO Take 1 tablet by mouth daily.    [provider]  Multiple Vitamin (MULTIVITAMIN) tablet Take 1 tablet by mouth daily.    [provider]  valACYclovir (VALTREX) 1000 MG tablet Take 1 tablet (1,000 mg total) by mouth as needed. 07/10/20   Liane Comber,  NP  Zoster Vaccine Adjuvanted [facility]) injection Give 0.5 mg IM x 1 at month 0; Repeat in 2-6 months for total of 2 doses. 07/10/19   Liane Comber, NP    Family History Family History  Problem Relation Age of Onset  . Breast cancer Maternal Grandmother   . Leukemia Paternal Aunt     Social History Social History   Tobacco Use  . Smoking status: Never Smoker  . Smokeless tobacco: Never Used  Vaping Use  . Vaping Use: Never used  Substance Use Topics  . Alcohol use: No    Comment: occasional  . Drug use: No     Allergies   Patient has no known allergies.   Review of Systems Review of Systems  All other systems reviewed and are negative.    Physical Exam Triage Vital Signs ED Triage Vitals  Enc Vitals Group     BP 09/08/20 1140 121/78     Pulse Rate 09/08/20 1140 62     Resp 09/08/20 1140 16     Temp 09/08/20 1140 98.1 F (36.7 C)     Temp src --      SpO2 09/08/20 1140 98 %     Weight --      Height --  Head Circumference --      Peak Flow --      Pain Score 09/08/20 1145 8     Pain Loc --      Pain Edu? --      Excl. in [place]? --    No data found.  Updated Vital Signs BP 121/78   Pulse 62   Temp 98.1 F (36.7 C)   Resp 16   LMP 05/31/2013   SpO2 98%   Visual Acuity Right Eye Distance:   Left Eye Distance:   Bilateral Distance:    Right Eye Near:   Left Eye Near:    Bilateral Near:     Physical Exam Vitals and nursing note reviewed.  Constitutional:      General: She is not in acute distress.    Appearance: Normal appearance. She is normal weight. She is not ill-appearing, toxic-appearing or diaphoretic.  HENT:     Head: Normocephalic.  Cardiovascular:     Rate and Rhythm: Normal rate and regular rhythm.     Pulses: Normal pulses.     Heart sounds: Normal heart sounds. No murmur heard.  No friction rub. No gallop.   Pulmonary:     Effort: Pulmonary effort is normal. No respiratory distress.     Breath sounds: Normal breath  sounds. No stridor. No wheezing, rhonchi or rales.  Chest:     Chest wall: No tenderness.  Musculoskeletal:        General: Tenderness present.     Right wrist: Normal.     Left wrist: Swelling and tenderness present.     Comments: The left wrist is without any obvious asymmetry or deformity when compared to the right wrist.  No surface trauma, ecchymosis, open wound, lesion, or warmth present.  Limited range of motion due to pain.  Neurovascular status intact.  Neurological:     Mental Status: She is alert and oriented to person, place, and time.      UC Treatments / Results  Labs (all labs ordered are listed, but only abnormal results are displayed) Labs Reviewed - No data to display  EKG   Radiology DG Wrist Complete Left  Result Date: 09/08/2020 CLINICAL DATA:  LEFT wrist injury after falling this morning, pain, deformity and swelling EXAM: LEFT WRIST - COMPLETE 3+ VIEW COMPARISON:  None FINDINGS: Osseous mineralization borderline decreased. Joint spaces preserved. Avulsion fracture at tip of ulnar styloid process. Transverse metaphyseal fracture distal LEFT radius with dorsal displacement and dorsal tilt of distal radial articular surface. No definite intra-articular extension seen. Soft tissue swelling at LEFT wrist, greatest volar. No additional fracture, dislocation, or bone destruction. IMPRESSION: Displaced and angulated distal LEFT radial metaphyseal fracture. Nondisplaced avulsion fracture at tip of LEFT ulnar styloid process. Electronically Signed   By: Lavonia Dana M.D.   On: 09/08/2020 12:06     X-ray is positive for acute fracture.  I have reviewed the x-ray myself and the radiologist interpretation.  I am in agreement with the radiologist interpretation.   Procedures Procedures (including critical care time)  Medications Ordered in UC Medications  ondansetron (ZOFRAN-ODT) disintegrating tablet 4 mg (4 mg Oral Given 09/08/20 1232)    Initial Impression /  Assessment and Plan / UC Course  I have reviewed the triage vital signs and the nursing notes.  Pertinent labs & imaging results that were available during my care of the patient were reviewed by me and considered in my medical decision making (see chart for details).  Final Clinical Impressions(s) / UC Diagnoses   Final diagnoses:  Closed displaced fracture of head of left radius, initial encounter  Closed nondisplaced fracture of styloid process of left ulna, initial encounter  Left wrist pain  Fall, initial encounter     Discharge Instructions     Take OTC Tylenol/ibuprofen as needed for pain Follow-up with orthopedic Follow RICE instruction that is attached Follow-up with PCP Return or go to ED for worsening of symptoms    ED Prescriptions    None     PDMP not reviewed this encounter.   Emerson Monte, FNP 09/08/20 1232

## 2020-09-08 NOTE — Discharge Instructions (Signed)
Take OTC Tylenol/ibuprofen as needed for pain Follow-up with orthopedic Follow RICE instruction that is attached Follow-up with PCP Return or go to ED for worsening of symptoms

## 2020-09-09 ENCOUNTER — Ambulatory Visit (INDEPENDENT_AMBULATORY_CARE_PROVIDER_SITE_OTHER): Payer: BC Managed Care – PPO | Admitting: Orthopedic Surgery

## 2020-09-09 ENCOUNTER — Telehealth: Payer: Self-pay | Admitting: Orthopedic Surgery

## 2020-09-09 ENCOUNTER — Encounter: Payer: Self-pay | Admitting: Orthopedic Surgery

## 2020-09-09 VITALS — BP 140/78 | HR 74 | Ht 63.5 in | Wt 146.0 lb

## 2020-09-09 DIAGNOSIS — W19XXXA Unspecified fall, initial encounter: Secondary | ICD-10-CM | POA: Diagnosis not present

## 2020-09-09 DIAGNOSIS — S52532A Colles' fracture of left radius, initial encounter for closed fracture: Secondary | ICD-10-CM

## 2020-09-09 NOTE — Progress Notes (Signed)
Patient ID: Julie Cochran, female   DOB: [birth year], 53 y.o.   MRN: [id number]  Chief Complaint  Patient presents with  . Wrist Injury    09/08/20 left wrist    54 year old female status post closed injury fall on outstretched hand left wrist.  Presents with a displaced shortened angulated left distal radius fracture.  Recommended treatment is closed reduction pinning versus open reduction internal fixation.  Patient has opted for   HPI Julie Cochran is a 55 y.o. female.   54 year old female with vitamin D deficiency history has a left distal radius fracture with displacement angulation and shortening  Complains of left wrist pain  ER records indicate patient fell backwards trying to pull some dead trees out of the ground.  Treatment included over-the-counter medications and splinting  Review of Systems Review of Systems  Cardiovascular: Negative for chest pain.  All other systems reviewed and are negative.    Past Medical History:  Diagnosis Date  . Depression   . HSV-1 (herpes simplex virus 1) infection   . Vitamin D deficiency     Past Surgical History:  Procedure Laterality Date  . ABDOMINAL HYSTERECTOMY  2014   Ronald Neal,MD. 7/14  . BREAST BIOPSY Right   . NOVASURE ABLATION    . TONSILLECTOMY AND ADENOIDECTOMY        Physical Exam 1 Last menstrual period 05/31/2013. Physical Exam 2 The patient is well developed well nourished and well groomed. 3 Orientation to person place and time is normal  4 Mood is pleasant.  5 Ambulatory status normal   6 Inspection of the left  wrist reveals MILD TO MOD  tenderness   MILD swelling MILD  deformity 7 Range of motion assessment: The range of motion is diminished primarily secondary to pain 8 Stability tests are deferred because of pain but the x-ray shows no subluxation of the joint 9 Strength assessment muscle tone is normal resistance testing is deferred because of pain and swelling  10 Nerve function NORMAL  11  Vascular function NORMAL  12 Local lymphatic system NEG   Opposite extremity RUE  there is no alignment abnormality, no contracture, no subluxation, no atrophy and neurovascular exam is intact  MEDICAL DECISION MAKING  A.  Encounter Diagnosis  Name Primary?  . Colles' fracture of left radius, initial encounter for closed fracture Yes    B. DATA ANALYSED:    IMAGING: Independent interpretation of images: YES SHORT DORSAL ANGUL D RAD FX   Orders: surgery   Outside records reviewed: YES ER   C. MANAGEMENT   SURGERY RECOMMENDED  CRPP VS ORIF LEFT WRIST   OOW 2 WEEKS THEN RE ASSESS  The procedure has been fully reviewed with the patient; The risks and benefits of surgery have been discussed and explained and understood. Alternative treatment has also been reviewed, questions were encouraged and answered. The postoperative plan is also been reviewed.   No orders of the defined types were placed in this encounter.

## 2020-09-09 NOTE — Patient Instructions (Signed)
Julie Cochran  09/09/2020     @PREFPERIOPPHARMACY @   Your procedure is scheduled on Wednesday, 09/11/20.  Report to [facility] at   Call this number if you have problems the morning of surgery:  [phone]   Remember:  Do not eat or drink after midnight.     Take these medicines the morning of surgery with A SIP OF WATER     Do not wear jewelry, make-up or nail polish.  Do not wear lotions, powders, or perfumes, or deodorant.  Do not shave 48 hours prior to surgery.  Men may shave face and neck.  Do not bring valuables to the hospital.  [facility] is not responsible for any belongings or valuables.  Contacts, dentures or bridgework may not be worn into surgery.  Leave your suitcase in the car.  After surgery it may be brought to your room.  For patients admitted to the hospital, discharge time will be determined by your treatment team.  Patients discharged the day of surgery will not be allowed to drive home.   Name and phone number of your driver:   family   Please read over the following fact sheets that you were given. Pain Booklet, Coughing and Deep Breathing, Surgical Site Infection Prevention, Anesthesia Post-op Instructions and Care and Recovery After Surgery      How to Use Chlorhexidine for Bathing Chlorhexidine gluconate (CHG) is a germ-killing (antiseptic) solution that is used to clean the skin. It can get rid of the bacteria that normally live on the skin and can keep them away for about 24 hours. To clean your skin with CHG, you may be given:  A CHG solution to use in the shower or as part of a sponge bath.  A prepackaged cloth that contains CHG. Cleaning your skin with CHG may help lower the risk for infection:  While you are staying in the intensive care unit of the hospital.  If you have a vascular access, such as a central line, to provide short-term or long-term access to your veins.  If you have a catheter to drain urine from your  bladder.  If you are on a ventilator. A ventilator is a machine that helps you breathe by moving air in and out of your lungs.  After surgery. What are the risks? Risks of using CHG include:  A skin reaction.  Hearing loss, if CHG gets in your ears.  Eye injury, if CHG gets in your eyes and is not rinsed out.  The CHG product catching fire. Make sure that you avoid smoking and flames after applying CHG to your skin. Do not use CHG:  If you have a chlorhexidine allergy or have previously reacted to chlorhexidine.  On babies younger than 59 months of age. How to use CHG solution  Use CHG only as told by your health care provider, and follow the instructions on the label.  Use the full amount of CHG as directed. Usually, this is one bottle. During a shower Follow these steps when using CHG solution during a shower (unless your health care provider gives you different instructions): 1. Start the shower. 2. Use your normal soap and shampoo to wash your face and hair. 3. Turn off the shower or move out of the shower stream. 4. Pour the CHG onto a clean washcloth. Do not use any type of brush or rough-edged sponge. 5. Starting at your neck, lather your body down to your toes. Make sure you follow these  instructions: ? If you will be having surgery, pay special attention to the part of your body where you will be having surgery. Scrub this area for at least 1 minute. ? Do not use CHG on your head or face. If the solution gets into your ears or eyes, rinse them well with water. ? Avoid your genital area. ? Avoid any areas of skin that have broken skin, cuts, or scrapes. ? Scrub your back and under your arms. Make sure to wash skin folds. 6. Let the lather sit on your skin for 1-2 minutes or as long as told by your health care provider. 7. Thoroughly rinse your entire body in the shower. Make sure that all body creases and crevices are rinsed well. 8. Dry off with a clean towel. Do not  put any substances on your body afterward--such as powder, lotion, or perfume--unless you are told to do so by your health care provider. Only use lotions that are recommended by the manufacturer. 9. Put on clean clothes or pajamas. 10. If it is the night before your surgery, sleep in clean sheets.  During a sponge bath Follow these steps when using CHG solution during a sponge bath (unless your health care provider gives you different instructions): 1. Use your normal soap and shampoo to wash your face and hair. 2. Pour the CHG onto a clean washcloth. 3. Starting at your neck, lather your body down to your toes. Make sure you follow these instructions: ? If you will be having surgery, pay special attention to the part of your body where you will be having surgery. Scrub this area for at least 1 minute. ? Do not use CHG on your head or face. If the solution gets into your ears or eyes, rinse them well with water. ? Avoid your genital area. ? Avoid any areas of skin that have broken skin, cuts, or scrapes. ? Scrub your back and under your arms. Make sure to wash skin folds. 4. Let the lather sit on your skin for 1-2 minutes or as long as told by your health care provider. 5. Using a different clean, wet washcloth, thoroughly rinse your entire body. Make sure that all body creases and crevices are rinsed well. 6. Dry off with a clean towel. Do not put any substances on your body afterward--such as powder, lotion, or perfume--unless you are told to do so by your health care provider. Only use lotions that are recommended by the manufacturer. 7. Put on clean clothes or pajamas. 8. If it is the night before your surgery, sleep in clean sheets. How to use CHG prepackaged cloths  Only use CHG cloths as told by your health care provider, and follow the instructions on the label.  Use the CHG cloth on clean, dry skin.  Do not use the CHG cloth on your head or face unless your health care provider  tells you to.  When washing with the CHG cloth: ? Avoid your genital area. ? Avoid any areas of skin that have broken skin, cuts, or scrapes. Before surgery Follow these steps when using a CHG cloth to clean before surgery (unless your health care provider gives you different instructions): 1. Using the CHG cloth, vigorously scrub the part of your body where you will be having surgery. Scrub using a back-and-forth motion for 3 minutes. The area on your body should be completely wet with CHG when you are done scrubbing. 2. Do not rinse. Discard the cloth and let the  area air-dry. Do not put any substances on the area afterward, such as powder, lotion, or perfume. 3. Put on clean clothes or pajamas. 4. If it is the night before your surgery, sleep in clean sheets.  For general bathing Follow these steps when using CHG cloths for general bathing (unless your health care provider gives you different instructions). 1. Use a separate CHG cloth for each area of your body. Make sure you wash between any folds of skin and between your fingers and toes. Wash your body in the following order, switching to a new cloth after each step: ? The front of your neck, shoulders, and chest. ? Both of your arms, under your arms, and your hands. ? Your stomach and groin area, avoiding the genitals. ? Your right leg and foot. ? Your left leg and foot. ? The back of your neck, your back, and your buttocks. 2. Do not rinse. Discard the cloth and let the area air-dry. Do not put any substances on your body afterward--such as powder, lotion, or perfume--unless you are told to do so by your health care provider. Only use lotions that are recommended by the manufacturer. 3. Put on clean clothes or pajamas. Contact a health care provider if:  Your skin gets irritated after scrubbing.  You have questions about using your solution or cloth. Get help right away if:  Your eyes become very red or swollen.  Your eyes  itch badly.  Your skin itches badly and is red or swollen.  Your hearing changes.  You have trouble seeing.  You have swelling or tingling in your mouth or throat.  You have trouble breathing.  You swallow any chlorhexidine. Summary  Chlorhexidine gluconate (CHG) is a germ-killing (antiseptic) solution that is used to clean the skin. Cleaning your skin with CHG may help to lower your risk for infection.  You may be given CHG to use for bathing. It may be in a bottle or in a prepackaged cloth to use on your skin. Carefully follow your health care provider's instructions and the instructions on the product label.  Do not use CHG if you have a chlorhexidine allergy.  Contact your health care provider if your skin gets irritated after scrubbing. This information is not intended to replace advice given to you by your health care provider. Make sure you discuss any questions you have with your health care provider. Document Revised: 03/02/2019 Document Reviewed: 11/11/2017 Elsevier Patient Education  [place]. Closed Reduction for Wrist or Forearm, Care After This sheet gives you information about how to care for yourself after your procedure. Your health care provider may also give you more specific instructions. If you have problems or questions, contact your health care provider. What can I expect after the procedure? After the procedure, it is common to have:  Pain.  Swelling. Follow these instructions at home: If you have a splint:   Wear the splint as told by your health care provider. Remove it only as told by your health care provider.  Loosen the splint if your fingers tingle, become numb, or turn cold and blue.  Keep the splint clean.  If the splint is not waterproof: ? Do not let it get wet. ? Cover it with a watertight covering when you take a bath or shower. If you have a cast:  Do not stick anything inside the cast to scratch your skin. Doing that  increases your risk of infection.  Check the skin around the cast  every day. Tell your health care provider about any concerns.  You may put lotion on dry skin around the edges of the cast. Do not put lotion on the skin underneath the cast.  Keep the cast clean.  If the cast is not waterproof: ? Do not let it get wet. ? Cover it with a watertight covering when you take a bath or shower. Managing pain, stiffness, and swelling   If directed, put ice on the injured area. To do this: ? If you have a removable splint, remove it as told by your health care provider. ? Put ice in a plastic bag. ? Place a towel between your skin and the bag or between your cast and the bag. ? Leave the ice on for 20 minutes, 2-3 times per day.  Move your fingers often to reduce stiffness and swelling.  Raise (elevate) the injured area above the level of your heart while you are sitting or lying down. Driving  Ask your health care provider if the medicine prescribed to you requires you to avoid driving or using heavy machinery.  Do not drive for 24 hours if you were given a sedative during your procedure.  Ask your health care provider when it is safe to drive if you have a cast or splint on your arm. Activity  Return to your normal activities as told by your health care provider. Ask your health care provider what activities are safe for you.  Do exercises as told by your health care provider. General instructions  Do not put pressure on any part of the cast or splint until it is fully hardened. This may take several hours.  Take over-the-counter and prescription medicines only as told by your health care provider.  Do not use any products that contain nicotine or tobacco, such as cigarettes, e-cigarettes, and chewing tobacco. These can delay bone healing. If you need help quitting, ask your health care provider.  Keep all follow-up visits as told by your health care provider. This is  important. Contact a health care provider if:  You have a fever.  Your pain is not controlled by your pain medicine. Get help right away if:  You have severe pain.  You have a severe increase in swelling.  Your fingers become very cold or blue.  You have numbness, tingling, or loss of feeling in your hands or fingers. Summary  After the procedure, it is common to have pain and swelling.  Return to your normal activities as told by your health care provider. Ask your health care provider what activities are safe for you.  Get help right away if you have a severe increase in swelling, your fingers become very cold or blue, or you have numbness, tingling, or loss of feeling in your hands or fingers. This information is not intended to replace advice given to you by your health care provider. Make sure you discuss any questions you have with your health care provider. Document Revised: 07/06/2019 Document Reviewed: 07/06/2019 Elsevier Patient Education  [place] Anesthesia, Adult, Care After This sheet gives you information about how to care for yourself after your procedure. Your health care provider may also give you more specific instructions. If you have problems or questions, contact your health care provider. What can I expect after the procedure? After the procedure, the following side effects are common:  Pain or discomfort at the IV site.  Nausea.  Vomiting.  Sore throat.  Trouble concentrating.  Feeling  cold or chills.  Weak or tired.  Sleepiness and fatigue.  Soreness and body aches. These side effects can affect parts of the body that were not involved in surgery. Follow these instructions at home:  For at least 24 hours after the procedure:  Have a responsible adult stay with you. It is important to have someone help care for you until you are awake and alert.  Rest as needed.  Do not: ? Participate in activities in which you could  fall or become injured. ? Drive. ? Use heavy machinery. ? Drink alcohol. ? Take sleeping pills or medicines that cause drowsiness. ? Make important decisions or sign legal documents. ? Take care of children on your own. Eating and drinking  Follow any instructions from your health care provider about eating or drinking restrictions.  When you feel hungry, start by eating small amounts of foods that are soft and easy to digest (bland), such as toast. Gradually return to your regular diet.  Drink enough fluid to keep your urine pale yellow.  If you vomit, rehydrate by drinking water, juice, or clear broth. General instructions  If you have sleep apnea, surgery and certain medicines can increase your risk for breathing problems. Follow instructions from your health care provider about wearing your sleep device: ? Anytime you are sleeping, including during daytime naps. ? While taking prescription pain medicines, sleeping medicines, or medicines that make you drowsy.  Return to your normal activities as told by your health care provider. Ask your health care provider what activities are safe for you.  Take over-the-counter and prescription medicines only as told by your health care provider.  If you smoke, do not smoke without supervision.  Keep all follow-up visits as told by your health care provider. This is important. Contact a health care provider if:  You have nausea or vomiting that does not get better with medicine.  You cannot eat or drink without vomiting.  You have pain that does not get better with medicine.  You are unable to pass urine.  You develop a skin rash.  You have a fever.  You have redness around your IV site that gets worse. Get help right away if:  You have difficulty breathing.  You have chest pain.  You have blood in your urine or stool, or you vomit blood. Summary  After the procedure, it is common to have a sore throat or nausea. It is  also common to feel tired.  Have a responsible adult stay with you for the first 24 hours after general anesthesia. It is important to have someone help care for you until you are awake and alert.  When you feel hungry, start by eating small amounts of foods that are soft and easy to digest (bland), such as toast. Gradually return to your regular diet.  Drink enough fluid to keep your urine pale yellow.  Return to your normal activities as told by your health care provider. Ask your health care provider what activities are safe for you. This information is not intended to replace advice given to you by your health care provider. Make sure you discuss any questions you have with your health care provider. Document Revised: 12/17/2017 Document Reviewed: 07/30/2017 Elsevier Patient Education  [place] Anesthesia, Adult General anesthesia is the use of medicines to make a person "go to sleep" (unconscious) for a medical procedure. General anesthesia must be used for certain procedures, and is often recommended for procedures that:  Last a long time.  Require you to be still or in an unusual position.  Are major and can cause blood loss. The medicines used for general anesthesia are called general anesthetics. As well as making you unconscious for a certain amount of time, these medicines:  Prevent pain.  Control your blood pressure.  Relax your muscles. Tell a health care provider about:  Any allergies you have.  All medicines you are taking, including vitamins, herbs, eye drops, creams, and over-the-counter medicines.  Any problems you or family members have had with anesthetic medicines.  Types of anesthetics you have had in the past.  Any blood disorders you have.  Any surgeries you have had.  Any medical conditions you have.  Any recent upper respiratory, chest, or ear infections.  Any history of: ? Heart or lung conditions, such as heart failure,  sleep apnea, asthma, or chronic obstructive pulmonary disease (COPD). ? Armed forces logistics/support/administrative officer. ? Depression or anxiety.  Any tobacco or drug use, including marijuana or alcohol use.  Whether you are pregnant or may be pregnant. What are the risks? Generally, this is a safe procedure. However, problems may occur, including:  Allergic reaction.  Lung and heart problems.  Inhaling food or liquid from the stomach into the lungs (aspiration).  Nerve injury.  Dental injury.  Air in the bloodstream, which can lead to stroke.  Extreme agitation or confusion (delirium) when you wake up from the anesthetic.  Waking up during your procedure and being unable to move. This is rare. These problems are more likely to develop if you are having a major surgery or if you have an advanced or serious medical condition. You can prevent some of these complications by answering all of your health care provider's questions thoroughly and by following all instructions before your procedure. General anesthesia can cause side effects, including:  Nausea or vomiting.  A sore throat from the breathing tube.  Hoarseness.  Wheezing or coughing.  Shaking chills.  Tiredness.  Body aches.  Anxiety.  Sleepiness or drowsiness.  Confusion or agitation. What happens before the procedure? Staying hydrated Follow instructions from your health care provider about hydration, which may include:  Up to 2 hours before the procedure - you may continue to drink clear liquids, such as water, clear fruit juice, black coffee, and plain tea.  Eating and drinking restrictions Follow instructions from your health care provider about eating and drinking, which may include:  8 hours before the procedure - stop eating heavy meals or foods such as meat, fried foods, or fatty foods.  6 hours before the procedure - stop eating light meals or foods, such as toast or cereal.  6 hours before the procedure - stop drinking  milk or drinks that contain milk.  2 hours before the procedure - stop drinking clear liquids. Medicines Ask your health care provider about:  Changing or stopping your regular medicines. This is especially important if you are taking diabetes medicines or blood thinners.  Taking medicines such as aspirin and ibuprofen. These medicines can thin your blood. Do not take these medicines unless your health care provider tells you to take them.  Taking over-the-counter medicines, vitamins, herbs, and supplements. Do not take these during the week before your procedure unless your health care provider approves them. General instructions  Starting 3-6 weeks before the procedure, do not use any products that contain nicotine or tobacco, such as cigarettes and e-cigarettes. If you need help quitting, ask your health care provider.  If you brush your teeth on the morning of the procedure, make sure to spit out all of the toothpaste.  Tell your health care provider if you become ill or develop a cold, cough, or fever.  If instructed by your health care provider, bring your sleep apnea device with you on the day of your surgery (if applicable).  Ask your health care provider if you will be going home the same day, the following day, or after a longer hospital stay. ? Plan to have someone take you home from the hospital or clinic. ? Plan to have a responsible adult care for you for at least 24 hours after you leave the hospital or clinic. This is important. What happens during the procedure?   You will be given anesthetics through both of the following: ? A mask placed over your nose and mouth. ? An IV in one of your veins.  You may receive a medicine to help you relax (sedative).  After you are unconscious, a breathing tube may be inserted down your throat to help you breathe. This will be removed before you wake up.  An anesthesia specialist will stay with you throughout your procedure. He or  she will: ? Keep you comfortable and safe by continuing to give you medicines and adjusting the amount of medicine that you get. ? Monitor your blood pressure, pulse, and oxygen levels to make sure that the anesthetics do not cause any problems. The procedure may vary among health care providers and hospitals. What happens after the procedure?  Your blood pressure, temperature, heart rate, breathing rate, and blood oxygen level will be monitored until the medicines you were given have worn off.  You will wake up in a recovery area. You may wake up slowly.  If you feel anxious or agitated, you may be given medicine to help you calm down.  If you will be going home the same day, your health care provider may check to make sure you can walk, drink, and urinate.  Your health care provider will treat any pain or side effects you have before you go home.  Do not drive for 24 hours if you were given a sedative. Summary  General anesthesia is used to keep you still and prevent pain during a procedure.  It is important to tell your health care provider about your medical history and any surgeries you have had, and previous experience with anesthesia.  Follow your health care provider's instructions about when to stop eating, drinking, or taking certain medicines before your procedure.  Plan to have someone take you home from the hospital or clinic. This information is not intended to replace advice given to you by your health care provider. Make sure you discuss any questions you have with your health care provider. Document Revised: 05/03/2018 Document Reviewed: 07/30/2017 Elsevier Patient Education  [place].

## 2020-09-09 NOTE — Telephone Encounter (Signed)
error 

## 2020-09-09 NOTE — Patient Instructions (Signed)
Colles Fracture  Colles fracture is a type of broken wrist. It means that the radius bone is broken or cracked near the wrist joint. The radius is one of two bones in the forearm. It is on the same side as the thumb. The other forearm bone is called the ulna. Often, when someone has a Colles fracture, the ulna is also broken. As this injury heals, a splint or a cast is used to prevent the injured bone from moving (keep it immobilized). What are the causes? Common causes of this type of fracture include:  A hard, direct hit to the wrist.  Accidents, such as a car accident or falling on an outstretched hand. What increases the risk? You may be at higher risk for this type of fracture if you:  Play contact sports or high-risk sports, such as skiing, biking, or ice-skating.  Smoke.  Drink more than three alcoholic beverages a day.  Have low or lowered bone density (osteoporosis or osteopenia).  Are a young child or an older adult.  Are a woman who has gone through menopause.  Have a history of bone fractures.  Are not getting enough (have a deficiency in) calcium or vitamin D. What are the signs or symptoms? Symptoms of a Colles fracture may include:  Tenderness, bruising, and swelling over the fracture, which is usually near the wrist.  The wrist hanging in an odd position or looking misshapen (deformed).  Difficulty moving the wrist. How is this diagnosed? This condition may be diagnosed based on:  A physical exam.  A forearm X-ray.  Your symptoms and medical history. How is this treated? Treatment depends on many factors, including your age, your activity level, and how severe your fracture is. Treatment may include:  Immobilizing the wrist with a splint or a cast for several weeks. Before a splint or cast is placed on the arm, the health care provider may move the fractured bone or bones back into place (realignment).  Surgery, if the bone is completely out of place  (displaced). Metal pins or other devices may be used to help hold the bone in place while it heals. After surgery, the arm is put in a splint or cast.  Physical therapy. Follow these instructions at home: If you have a splint:  Wear the splint as told by your health care provider. Remove it only as told by your health care provider.  Loosen the splint if your fingers tingle, become numb, or turn cold and blue.  Keep the splint clean.  If your splint is not waterproof: ? Do not let it get wet. ? Cover it with a watertight covering when you take a bath or a shower. If you have a cast:  Do not stick anything inside the cast to scratch your skin. Doing that increases your risk for infection.  Check the skin around the cast every day. Tell your health care provider about any concerns.  You may put lotion on dry skin around the edges of the cast. Do not put lotion on the skin underneath the cast.  Keep the cast clean.  If the cast is not waterproof: ? Do not let it get wet. ? Cover it with a watertight covering when you take a bath or a shower. Managing pain, stiffness, and swelling   If directed, put ice on the injured area: ? If you have a removable splint, remove it as told by your health care provider. ? Put ice in a plastic bag. ?  Place a towel between your skin and the bag, or between your cast and the bag. ? Leave the ice on for 20 minutes, 2-3 times a day.  Move your fingers often to avoid stiffness and to lessen swelling.  Raise (elevate) your wrist above the level of your heart while you are sitting or lying down. Driving  Do not drive or use heavy machinery while taking prescription pain medicine.  Ask your health care provider if it is safe for you to drive if you have a splint or cast on your arm. Activity  Do not lift anything that is heavier than 10 lb (4.5 kg), or the limit that you are told, until your health care provider says that it is safe.  Do not use  your arm to support your body weight until your health care provider says that you can.  Return to your normal activities as told by your health care provider. Ask your health care provider what activities are safe for you.  If physical therapy was prescribed, do exercises as told by your health care provider. General instructions  Do not put pressure on any part of the splint or cast until it is fully hardened. This may take several hours.  Do not use any products that contain nicotine or tobacco, such as cigarettes and e-cigarettes. These can delay bone healing. If you need help quitting, ask your health care provider.  Take over-the-counter and prescription medicines only as told by your health care provider.  Keep all follow-up visits as told by your health care provider. This is important. Contact a health care provider if:  Your splint or cast: ? Gets wet. ? Gets damaged. ? Suddenly feels too tight.  You have: ? A fever or chills. ? Pain that does not get better with medicine. ? Swelling that gets worse. Get help right away if:  Your hand or fingernails: ? Turn blue or gray. ? Feel cold or numb.  You have tingling or numbness in your fingers, even after you loosen your splint (if this applies). Summary  Colles fracture is a type of broken wrist. It often involves both of the bones in the forearm (radius and ulna).  Fractures are common in young, active people who are involved in high-energy activities. They are also common in older people who are at risk for osteoporosis.  This injury is diagnosed with a physical exam and X-rays.  Your wrist will need to be held in place (immobilized) with a splint or cast for several weeks. You may need surgery for a more severe fracture. This information is not intended to replace advice given to you by your health care provider. Make sure you discuss any questions you have with your health care provider. Document Revised: 11/26/2017  Document Reviewed: 11/12/2017 Elsevier Patient Education  2020 [place].  Vitamin D Deficiency Vitamin D deficiency is when your body does not have enough vitamin D. Vitamin D is important to your body for many reasons:  It helps the body absorb two important minerals--calcium and phosphorus.  It plays a role in bone health.  It may help to prevent some diseases, such as diabetes and multiple sclerosis.  It plays a role in muscle function, including heart function. If vitamin D deficiency is severe, it can cause a condition in which your bones become soft. In adults, this condition is called osteomalacia. In children, this condition is called rickets. What are the causes? This condition may be caused by:  Not  eating enough foods that contain vitamin D.  Not getting enough natural sun exposure.  Having certain digestive system diseases that make it difficult for your body to absorb vitamin D. These diseases include Crohn's disease, chronic pancreatitis, and cystic fibrosis.  Having a surgery in which a part of the stomach or a part of the small intestine is removed.  Having chronic kidney disease or liver disease. What increases the risk? You are more likely to develop this condition if you:  Are older.  Do not spend much time outdoors.  Live in a long-term care facility.  Have had broken bones.  Have weak or thin bones (osteoporosis).  Have a disease or condition that changes how the body absorbs vitamin D.  Have dark skin.  Take certain medicines, such as steroid medicines or certain seizure medicines.  Are overweight or obese. What are the signs or symptoms? In mild cases of vitamin D deficiency, there may not be any symptoms. If the condition is severe, symptoms may include:  Bone pain.  Muscle pain.  Falling often.  Broken bones caused by a minor injury. How is this diagnosed? This condition may be diagnosed with blood tests. Imaging tests such as  X-rays may also be done to look for changes in the bone. How is this treated? Treatment for this condition may depend on what caused the condition. Treatment options include:  Taking vitamin D supplements. Your health care provider will suggest what dose is best for you.  Taking a calcium supplement. Your health care provider will suggest what dose is best for you. Follow these instructions at home: Eating and drinking   Eat foods that contain vitamin D. Choices include: ? Fortified dairy products, cereals, or juices. Fortified means that vitamin D has been added to the food. Check the label on the package to see if the food is fortified. ? Fatty fish, such as salmon or trout. ? Eggs. ? Oysters. ? Mushrooms. The items listed above may not be a complete list of recommended foods and beverages. Contact a dietitian for more information. General instructions  Take medicines and supplements only as told by your health care provider.  Get regular, safe exposure to natural sunlight.  Do not use a tanning bed.  Maintain a healthy weight. Lose weight if needed.  Keep all follow-up visits as told by your health care provider. This is important. How is this prevented? You can get vitamin D by:  Eating foods that naturally contain vitamin D.  Eating or drinking products that have been fortified with vitamin D, such as cereals, juices, and dairy products (including milk).  Taking a vitamin D supplement or a multivitamin supplement that contains vitamin D.  Being in the sun. Your body naturally makes vitamin D when your skin is exposed to sunlight. Your body changes the sunlight into a form of the vitamin that it can use. Contact a health care provider if:  Your symptoms do not go away.  You feel nauseous or you vomit.  You have fewer bowel movements than usual or are constipated. Summary  Vitamin D deficiency is when your body does not have enough vitamin D.  Vitamin D is  important to your body for good bone health and muscle function, and it may help prevent some diseases.  Vitamin D deficiency is primarily treated through supplementation. Your health care provider will suggest what dose is best for you.  You can get vitamin D by eating foods that contain vitamin D, by  being in the sun, and by taking a vitamin D supplement or a multivitamin supplement that contains vitamin D. This information is not intended to replace advice given to you by your health care provider. Make sure you discuss any questions you have with your health care provider. Document Revised: 08/22/2018 Document Reviewed: 08/22/2018 Elsevier Patient Education  [employer].

## 2020-09-10 ENCOUNTER — Encounter (HOSPITAL_COMMUNITY)
Admission: RE | Admit: 2020-09-10 | Discharge: 2020-09-10 | Disposition: A | Discharge status: Home / Self Care | Payer: BC Managed Care – PPO | Source: Ambulatory Visit | Attending: Orthopedic Surgery | Admitting: Orthopedic Surgery

## 2020-09-10 ENCOUNTER — Other Ambulatory Visit (HOSPITAL_COMMUNITY)
Admission: RE | Admit: 2020-09-10 | Discharge: 2020-09-10 | Disposition: A | Discharge status: Home / Self Care | Payer: BC Managed Care – PPO | Source: Ambulatory Visit | Attending: Orthopedic Surgery | Admitting: Orthopedic Surgery

## 2020-09-10 ENCOUNTER — Encounter (HOSPITAL_COMMUNITY): Payer: Self-pay

## 2020-09-10 ENCOUNTER — Other Ambulatory Visit: Payer: Self-pay

## 2020-09-10 DIAGNOSIS — Z01812 Encounter for preprocedural laboratory examination: Secondary | ICD-10-CM | POA: Diagnosis not present

## 2020-09-10 DIAGNOSIS — Z20822 Contact with and (suspected) exposure to covid-19: Secondary | ICD-10-CM | POA: Diagnosis not present

## 2020-09-10 LAB — CBC WITH DIFFERENTIAL/PLATELET
Abs Immature Granulocytes: 0.01 10*3/uL (ref 0.00–0.07)
Basophils Absolute: 0 10*3/uL (ref 0.0–0.1)
Basophils Relative: 1 %
Eosinophils Absolute: 0.1 10*3/uL (ref 0.0–0.5)
Eosinophils Relative: 2 %
HCT: 38.4 % (ref 36.0–46.0)
Hemoglobin: 12.3 g/dL (ref 12.0–15.0)
Immature Granulocytes: 0 %
Lymphocytes Relative: 31 %
Lymphs Abs: 1.8 10*3/uL (ref 0.7–4.0)
MCH: 29.6 pg (ref 26.0–34.0)
MCHC: 32 g/dL (ref 30.0–36.0)
MCV: 92.3 fL (ref 80.0–100.0)
Monocytes Absolute: 0.4 10*3/uL (ref 0.1–1.0)
Monocytes Relative: 8 %
Neutro Abs: 3.4 10*3/uL (ref 1.7–7.7)
Neutrophils Relative %: 58 %
Platelets: 231 10*3/uL (ref 150–400)
RBC: 4.16 MIL/uL (ref 3.87–5.11)
RDW: 12.3 % (ref 11.5–15.5)
WBC: 5.8 10*3/uL (ref 4.0–10.5)
nRBC: 0 % (ref 0.0–0.2)

## 2020-09-10 LAB — BASIC METABOLIC PANEL
Anion gap: 8 (ref 5–15)
BUN: 14 mg/dL (ref 6–20)
CO2: 27 mmol/L (ref 22–32)
Calcium: 9.5 mg/dL (ref 8.9–10.3)
Chloride: 107 mmol/L (ref 98–111)
Creatinine, Ser: 0.85 mg/dL (ref 0.44–1.00)
GFR calc Af Amer: >60 mL/min (ref >60)
GFR calc non Af Amer: >60 mL/min (ref >60)
Glucose, Bld: 88 mg/dL (ref 70–99)
Potassium: 4.3 mmol/L (ref 3.5–5.1)
Sodium: 142 mmol/L (ref 135–145)

## 2020-09-11 ENCOUNTER — Ambulatory Visit (HOSPITAL_COMMUNITY): Payer: BC Managed Care – PPO | Admitting: Anesthesiology

## 2020-09-11 ENCOUNTER — Ambulatory Visit (HOSPITAL_COMMUNITY): Payer: BC Managed Care – PPO

## 2020-09-11 ENCOUNTER — Encounter (HOSPITAL_COMMUNITY): Payer: Self-pay | Admitting: Orthopedic Surgery

## 2020-09-11 ENCOUNTER — Encounter (HOSPITAL_COMMUNITY)
Admission: RE | Disposition: A | Discharge status: Home / Self Care | Payer: Self-pay | Source: Home / Self Care | Attending: Orthopedic Surgery

## 2020-09-11 ENCOUNTER — Ambulatory Visit (HOSPITAL_COMMUNITY)
Admission: RE | Admit: 2020-09-11 | Discharge: 2020-09-11 | Disposition: A | Discharge status: Home / Self Care | Payer: BC Managed Care – PPO | Attending: Orthopedic Surgery | Admitting: Orthopedic Surgery

## 2020-09-11 DIAGNOSIS — W19XXXA Unspecified fall, initial encounter: Secondary | ICD-10-CM | POA: Insufficient documentation

## 2020-09-11 DIAGNOSIS — F329 Major depressive disorder, single episode, unspecified: Secondary | ICD-10-CM | POA: Insufficient documentation

## 2020-09-11 DIAGNOSIS — S52592D Other fractures of lower end of left radius, subsequent encounter for closed fracture with routine healing: Secondary | ICD-10-CM | POA: Diagnosis not present

## 2020-09-11 DIAGNOSIS — T148XXA Other injury of unspecified body region, initial encounter: Secondary | ICD-10-CM

## 2020-09-11 DIAGNOSIS — E559 Vitamin D deficiency, unspecified: Secondary | ICD-10-CM | POA: Insufficient documentation

## 2020-09-11 DIAGNOSIS — Y939 Activity, unspecified: Secondary | ICD-10-CM | POA: Insufficient documentation

## 2020-09-11 DIAGNOSIS — Z90710 Acquired absence of both cervix and uterus: Secondary | ICD-10-CM | POA: Diagnosis not present

## 2020-09-11 DIAGNOSIS — S52502D Unspecified fracture of the lower end of left radius, subsequent encounter for closed fracture with routine healing: Secondary | ICD-10-CM | POA: Diagnosis not present

## 2020-09-11 DIAGNOSIS — S52532A Colles' fracture of left radius, initial encounter for closed fracture: Secondary | ICD-10-CM

## 2020-09-11 DIAGNOSIS — S52502A Unspecified fracture of the lower end of left radius, initial encounter for closed fracture: Secondary | ICD-10-CM | POA: Diagnosis not present

## 2020-09-11 HISTORY — DX: Other specified postprocedural states: Z98.890

## 2020-09-11 HISTORY — PX: ORIF WRIST FRACTURE: SHX2133

## 2020-09-11 HISTORY — DX: Other specified postprocedural states: R11.2

## 2020-09-11 LAB — SARS CORONAVIRUS 2 (TAT 6-24 HRS): SARS Coronavirus 2: NEGATIVE

## 2020-09-11 SURGERY — OPEN REDUCTION INTERNAL FIXATION (ORIF) WRIST FRACTURE
Anesthesia: Regional | Site: Wrist | Laterality: Left

## 2020-09-11 MED ORDER — DEXAMETHASONE SODIUM PHOSPHATE 4 MG/ML IJ SOLN
INTRAMUSCULAR | Status: DC | PRN
  Administered 2020-09-11: 8 mg via PERINEURAL

## 2020-09-11 MED ORDER — MIDAZOLAM HCL 2 MG/2ML IJ SOLN
INTRAMUSCULAR | Status: AC
  Filled 2020-09-11: qty 2

## 2020-09-11 MED ORDER — ACETAMINOPHEN-CODEINE #3 300-30 MG PO TABS: 1.0000 | ORAL_TABLET | ORAL | 0 refills | Status: AC | PRN

## 2020-09-11 MED ORDER — ROPIVACAINE HCL 5 MG/ML IJ SOLN
INTRAMUSCULAR | Status: AC
  Filled 2020-09-11: qty 30

## 2020-09-11 MED ORDER — DEXAMETHASONE SODIUM PHOSPHATE 4 MG/ML IJ SOLN
INTRAMUSCULAR | Status: AC
  Filled 2020-09-11: qty 2

## 2020-09-11 MED ORDER — MIDAZOLAM HCL 2 MG/2ML IJ SOLN
2.0000 mg | Freq: Once | INTRAMUSCULAR | Status: AC
  Administered 2020-09-11: 2 mg via INTRAVENOUS

## 2020-09-11 MED ORDER — BUPIVACAINE HCL (PF) 0.5 % IJ SOLN
INTRAMUSCULAR | Status: AC
  Filled 2020-09-11: qty 30

## 2020-09-11 MED ORDER — CEFAZOLIN SODIUM-DEXTROSE 2-4 GM/100ML-% IV SOLN
INTRAVENOUS | Status: AC
  Filled 2020-09-11: qty 100

## 2020-09-11 MED ORDER — LACTATED RINGERS IV SOLN: INTRAVENOUS | Status: DC | PRN

## 2020-09-11 MED ORDER — ACETAMINOPHEN 500 MG PO TABS
500.0000 mg | ORAL_TABLET | Freq: Once | ORAL | Status: AC
  Administered 2020-09-11: 500 mg via ORAL
  Filled 2020-09-11: qty 1

## 2020-09-11 MED ORDER — 0.9 % SODIUM CHLORIDE (POUR BTL) OPTIME
TOPICAL | Status: DC | PRN
  Administered 2020-09-11: 1000 mL

## 2020-09-11 MED ORDER — IBUPROFEN 800 MG PO TABS: 800.0000 mg | ORAL_TABLET | Freq: Three times a day (TID) | ORAL | 1 refills | Status: DC | PRN

## 2020-09-11 MED ORDER — PROPOFOL 10 MG/ML IV BOLUS
INTRAVENOUS | Status: AC
  Filled 2020-09-11: qty 40

## 2020-09-11 MED ORDER — LIDOCAINE HCL (PF) 1 % IJ SOLN
INTRAMUSCULAR | Status: DC | PRN
  Administered 2020-09-11: 3 mL

## 2020-09-11 MED ORDER — LACTATED RINGERS IV SOLN: Freq: Once | INTRAVENOUS | Status: AC

## 2020-09-11 MED ORDER — PROPOFOL 10 MG/ML IV BOLUS
INTRAVENOUS | Status: DC | PRN
  Administered 2020-09-11: 70 mg via INTRAVENOUS
  Administered 2020-09-11: 100 ug/kg/min via INTRAVENOUS

## 2020-09-11 MED ORDER — ROPIVACAINE HCL 5 MG/ML IJ SOLN
INTRAMUSCULAR | Status: DC | PRN
  Administered 2020-09-11: 28 mL via EPIDURAL

## 2020-09-11 MED ORDER — PROMETHAZINE HCL 25 MG/ML IJ SOLN: 6.2500 mg | INTRAMUSCULAR | Status: DC | PRN

## 2020-09-11 MED ORDER — LIDOCAINE HCL (PF) 1 % IJ SOLN
INTRAMUSCULAR | Status: AC
  Filled 2020-09-11: qty 30

## 2020-09-11 MED ORDER — IBUPROFEN 800 MG PO TABS
800.0000 mg | ORAL_TABLET | Freq: Once | ORAL | Status: AC
  Administered 2020-09-11: 800 mg via ORAL
  Filled 2020-09-11: qty 1

## 2020-09-11 MED ORDER — MEPERIDINE HCL 50 MG/ML IJ SOLN: 6.2500 mg | INTRAMUSCULAR | Status: DC | PRN

## 2020-09-11 MED ORDER — ORAL CARE MOUTH RINSE: 15.0000 mL | Freq: Once | OROMUCOSAL | Status: AC

## 2020-09-11 MED ORDER — HYDROMORPHONE HCL 1 MG/ML IJ SOLN: 0.2500 mg | INTRAMUSCULAR | Status: DC | PRN

## 2020-09-11 MED ORDER — CEFAZOLIN SODIUM-DEXTROSE 2-4 GM/100ML-% IV SOLN
2.0000 g | INTRAVENOUS | Status: AC
  Administered 2020-09-11: 2 g via INTRAVENOUS

## 2020-09-11 MED ORDER — FENTANYL CITRATE (PF) 100 MCG/2ML IJ SOLN
INTRAMUSCULAR | Status: AC
  Filled 2020-09-11: qty 2

## 2020-09-11 MED ORDER — CHLORHEXIDINE GLUCONATE 0.12 % MT SOLN
15.0000 mL | Freq: Once | OROMUCOSAL | Status: AC
  Administered 2020-09-11: 15 mL via OROMUCOSAL

## 2020-09-11 SURGICAL SUPPLY — 69 items
APL PRP STRL LF ISPRP CHG 10.5 (MISCELLANEOUS)
APL SKNCLS STERI-STRIP NONHPOA (GAUZE/BANDAGES/DRESSINGS) ×1
APPLICATOR CHLORAPREP 10.5 ORG (MISCELLANEOUS) IMPLANT
BANDAGE ACE 3X5.8 VEL STRL LF (GAUZE/BANDAGES/DRESSINGS) ×2 IMPLANT
BENZOIN TINCTURE PRP APPL 2/3 (GAUZE/BANDAGES/DRESSINGS) ×2 IMPLANT
BIT DRILL 2 FAST STEP (BIT) ×2 IMPLANT
BIT DRILL 2.5X4 QC (BIT) ×2 IMPLANT
BLADE 15 SAFETY STRL DISP (BLADE) IMPLANT
BNDG CMPR STD VLCR NS LF 5.8X4 (GAUZE/BANDAGES/DRESSINGS) ×1
BNDG COHESIVE 4X5 TAN STRL (GAUZE/BANDAGES/DRESSINGS) ×2 IMPLANT
BNDG ELASTIC 4X5.8 VLCR NS LF (GAUZE/BANDAGES/DRESSINGS) ×2 IMPLANT
BNDG GAUZE ELAST 4 BULKY (GAUZE/BANDAGES/DRESSINGS) IMPLANT
CAP PIN PROTECTOR ORTHO WHT (CAP) IMPLANT
CAP PROTEC 4.0MM PINS (MISCELLANEOUS)
CAP PROTECT 4.0MM PIN (MISCELLANEOUS) IMPLANT
CLAMP ADJ DIST RAD FIX 4.0MM (EXFIX) IMPLANT
CLOSURE STERI STRIP 1/2 X4 (GAUZE/BANDAGES/DRESSINGS) ×2 IMPLANT
CLOTH BEACON ORANGE TIMEOUT ST (SAFETY) ×2 IMPLANT
COVER LIGHT HANDLE STERIS (MISCELLANEOUS) ×4 IMPLANT
COVER WAND RF STERILE (DRAPES) ×2 IMPLANT
CUFF TOURN SGL QUICK 18X4 (TOURNIQUET CUFF) ×2 IMPLANT
CUFF TOURN SGL QUICK 24 (TOURNIQUET CUFF) ×2
CUFF TRNQT CYL 24X4X16.5-23 (TOURNIQUET CUFF) ×1 IMPLANT
DRAPE C-ARM FOLDED MOBILE STRL (DRAPES) ×2 IMPLANT
DRSG XEROFORM 1X8 (GAUZE/BANDAGES/DRESSINGS) IMPLANT
DURAPREP 26ML APPLICATOR (WOUND CARE) ×2 IMPLANT
GAUZE SPONGE 4X4 12PLY STRL (GAUZE/BANDAGES/DRESSINGS) ×2 IMPLANT
GAUZE XEROFORM 5X9 LF (GAUZE/BANDAGES/DRESSINGS) ×2 IMPLANT
GLOVE BIOGEL M 7.0 STRL (GLOVE) ×2 IMPLANT
GLOVE BIOGEL PI IND STRL 7.0 (GLOVE) ×2 IMPLANT
GLOVE BIOGEL PI IND STRL 7.5 (GLOVE) ×1 IMPLANT
GLOVE BIOGEL PI INDICATOR 7.0 (GLOVE) ×2
GLOVE BIOGEL PI INDICATOR 7.5 (GLOVE) ×1
GLOVE SKINSENSE NS SZ8.0 LF (GLOVE) ×1
GLOVE SKINSENSE STRL SZ8.0 LF (GLOVE) ×1 IMPLANT
GLOVE SS N UNI LF 8.5 STRL (GLOVE) ×2 IMPLANT
GOWN STRL REUS W/TWL LRG LVL3 (GOWN DISPOSABLE) ×2 IMPLANT
GOWN STRL REUS W/TWL XL LVL3 (GOWN DISPOSABLE) ×2 IMPLANT
GOWN STRL REUS W/TWL XL LVL4 (GOWN DISPOSABLE) ×2 IMPLANT
K-WIRE 1.6 (WIRE) ×2
K-WIRE FX5X1.6XNS BN SS (WIRE) ×1
KIT TURNOVER KIT A (KITS) ×2 IMPLANT
KWIRE FX5X1.6XNS BN SS (WIRE) ×1 IMPLANT
NEEDLE HYPO 21X1 ECLIPSE (NEEDLE) ×2 IMPLANT
NS IRRIG 1000ML POUR BTL (IV SOLUTION) ×2 IMPLANT
PACK BASIC LIMB (CUSTOM PROCEDURE TRAY) ×4 IMPLANT
PAD CAST 4YDX4 CTTN HI CHSV (CAST SUPPLIES) IMPLANT
PADDING CAST COTTON 4X4 STRL (CAST SUPPLIES)
PADDING WEBRIL 4 STERILE (GAUZE/BANDAGES/DRESSINGS) ×4 IMPLANT
PEG SUBCHONDRAL SMOOTH 2.0X20 (Peg) ×4 IMPLANT
PIN CAPS ORTHO GREEN .062 (PIN) IMPLANT
PLATE SHORT 21.6X48.9 NRRW LT (Plate) ×2 IMPLANT
SCREW BN 12X3.5XNS CORT TI (Screw) ×2 IMPLANT
SCREW CORT 3.5X12 (Screw) ×4 IMPLANT
SCREW CORT 3.5X14 LNG (Screw) ×2 IMPLANT
SCREW PEG LOCK 2.5X16 (Peg) ×2 IMPLANT
SCREW PEG LOCK 2.5X20 (Peg) ×4 IMPLANT
SCREW PEG LOCK 2.5X24 (Peg) ×2 IMPLANT
SCREW SHANZ SD EXFX 4/3X80 (EXFIX) IMPLANT
SPLINT IMMOBILIZER J 3INX20FT (CAST SUPPLIES) ×1
SPLINT J IMMOBILIZER 3X20FT (CAST SUPPLIES) ×1 IMPLANT
SPLINT J IMMOBILIZER 4X20FT (CAST SUPPLIES) IMPLANT
SPLINT J PLASTER J 4INX20Y (CAST SUPPLIES)
SUT ETHILON 3 0 FSL (SUTURE) ×2 IMPLANT
SUT MON AB 2-0 SH 27 (SUTURE) ×4
SUT MON AB 2-0 SH27 (SUTURE) ×2 IMPLANT
SYR CONTROL 10ML LL (SYRINGE) ×2 IMPLANT
TOWEL OR 17X26 4PK STRL BLUE (TOWEL DISPOSABLE) ×2 IMPLANT
YANKAUER SUCT 12FT TUBE ARGYLE (SUCTIONS) ×2 IMPLANT

## 2020-09-11 NOTE — Transfer of Care (Signed)
Immediate Anesthesia Transfer of Care Note  Patient: Julie Cochran  Procedure(s) Performed: OPEN REDUCTION INTERNAL FIXATION (ORIF) WRIST FRACTURE (Left Wrist)  Patient Location: PACU  Anesthesia Type:MAC and Regional  Level of Consciousness: awake, alert , oriented and patient cooperative  Airway & Oxygen Therapy: Patient Spontanous Breathing  Post-op Assessment: Report given to RN, Post -op Vital signs reviewed and stable and Patient moving all extremities  Post vital signs: Reviewed and stable  Last Vitals:  Vitals Value Taken Time  BP    Temp    Pulse 64 09/11/20 1431  Resp 16 09/11/20 1431  SpO2 94 % 09/11/20 1431  Vitals shown include unvalidated device data.  Last Pain:  Vitals:   09/11/20 1151  TempSrc: Oral  PainSc: 10-Worst pain ever      Patients Stated Pain Goal: 5 (76/19/50 9326)  Complications: No complications documented.

## 2020-09-11 NOTE — Interval H&P Note (Signed)
History and Physical Interval Note:  09/11/2020 12:52 PM  Julie Cochran  has presented today for surgery, with the diagnosis of left wrist fracture.  The various methods of treatment have been discussed with the patient and family. After consideration of risks, benefits and other options for treatment, the patient has consented to  Procedure(s): CLOSED REDUCTION LEFT WRIST FRACTURE WITH PINS (Left) POSSIBLE OPEN REDUCTION INTERNAL FIXATION (ORIF) WRIST FRACTURE (Left) as a surgical intervention.  The patient's history has been reviewed, patient examined, no change in status, stable for surgery.  I have reviewed the patient's chart and labs.  Questions were answered to the patient's satisfaction.     Arther Abbott

## 2020-09-11 NOTE — Addendum Note (Signed)
Addendum  created 09/11/20 1530 by Denese Killings, MD   Charge Capture section accepted

## 2020-09-11 NOTE — Discharge Instructions (Signed)
General Anesthesia, Adult, Care After This sheet gives you information about how to care for yourself after your procedure. Your health care provider may also give you more specific instructions. If you have problems or questions, contact your health care provider. What can I expect after the procedure? After the procedure, the following side effects are common:  Pain or discomfort at the IV site.  Nausea.  Vomiting.  Sore throat.  Trouble concentrating.  Feeling cold or chills.  Weak or tired.  Sleepiness and fatigue.  Soreness and body aches. These side effects can affect parts of the body that were not involved in surgery. Follow these instructions at home:  For at least 24 hours after the procedure:  Have a responsible adult stay with you. It is important to have someone help care for you until you are awake and alert.  Rest as needed.  Do not: ? Participate in activities in which you could fall or become injured. ? Drive. ? Use heavy machinery. ? Drink alcohol. ? Take sleeping pills or medicines that cause drowsiness. ? Make important decisions or sign legal documents. ? Take care of children on your own. Eating and drinking  Follow any instructions from your health care provider about eating or drinking restrictions.  When you feel hungry, start by eating small amounts of foods that are soft and easy to digest (bland), such as toast. Gradually return to your regular diet.  Drink enough fluid to keep your urine pale yellow.  If you vomit, rehydrate by drinking water, juice, or clear broth. General instructions  If you have sleep apnea, surgery and certain medicines can increase your risk for breathing problems. Follow instructions from your health care provider about wearing your sleep device: ? Anytime you are sleeping, including during daytime naps. ? While taking prescription pain medicines, sleeping medicines, or medicines that make you drowsy.  Return to  your normal activities as told by your health care provider. Ask your health care provider what activities are safe for you.  Take over-the-counter and prescription medicines only as told by your health care provider.  If you smoke, do not smoke without supervision.  Keep all follow-up visits as told by your health care provider. This is important. Contact a health care provider if:  You have nausea or vomiting that does not get better with medicine.  You cannot eat or drink without vomiting.  You have pain that does not get better with medicine.  You are unable to pass urine.  You develop a skin rash.  You have a fever.  You have redness around your IV site that gets worse. Get help right away if:  You have difficulty breathing.  You have chest pain.  You have blood in your urine or stool, or you vomit blood. Summary  After the procedure, it is common to have a sore throat or nausea. It is also common to feel tired.  Have a responsible adult stay with you for the first 24 hours after general anesthesia. It is important to have someone help care for you until you are awake and alert.  When you feel hungry, start by eating small amounts of foods that are soft and easy to digest (bland), such as toast. Gradually return to your regular diet.  Drink enough fluid to keep your urine pale yellow.  Return to your normal activities as told by your health care provider. Ask your health care provider what activities are safe for you. This information is not   intended to replace advice given to you by your health care provider. Make sure you discuss any questions you have with your health care provider. Document Revised: 12/17/2017 Document Reviewed: 07/30/2017 Elsevier Patient Education  2020 [employer]  

## 2020-09-11 NOTE — Op Note (Signed)
09/11/2020  2:30 PM  PATIENT:  Julie Cochran  54 y.o. female  PRE-OPERATIVE DIAGNOSIS:  left wrist fracture  POST-OPERATIVE DIAGNOSIS:  left wrist fracture  DISTAL RADIUS EXTRA ARTICULAR   PROCEDURE:  Procedure(s): OPEN REDUCTION INTERNAL FIXATION (ORIF) WRIST FRACTURE (Left)  DVR PLATE   8 IMPLANT SCREWS   54 year old female who presented with a distal radius fracture with displacement options were nonoperative versus operative treatment.  Operative treatment was chosen.  The patient was seen in preop chart review was completed wrist and hand reexamined, everything look good for surgery and she was taken to the operating room after she got a supraclavicular block by anesthesia department  She was given general anesthesia as well and her left arm was prepped and draped sterilely.  After timeout was taken the fracture was evaluated and found to be too unstable for attempted closed reduction so an open reduction was performed  We did a volar approach of Julie Cochran with skin incision identification of FCR tendon opening of the sheath retraction ulnarly finding the space between the flexor tendons and the pronator quadratus followed by L-shaped incision and subperiosteal dissection of the distal radius.  A periosteal elevator was placed in the fracture site and the fracture was levered distally to gain length and then flexion was used to restore volar tilt  K wire was placed through a small stab incision to hold the fracture and then x-ray was obtained.  Fracture was reduced  We proceeded to place the DVR plate using the oblong hole drilling and placing the screw after appropriate measurement.  We adjusted the plate using the x-ray as guidance and then serially starting from the ulnar proximal side place the appropriate screw starting with threaded pegs to like the plate to the bone and then distally we used smooth pegs we did not use the last screw hole.  We then placed 2 more screws in the  cortical bone  Final images show fracture reduction and appropriate hardware position  The wound was irrigated and closed with 2-0 Monocryl subcuticular stitch followed by benzoin Steri-Strips dressings and volar splint    SURGEON:  Surgeon(s) and Role:    * Carole Civil, MD - Primary  PHYSICIAN ASSISTANT:   ASSISTANTS: DEBI DALLAS   ANESTHESIA:   general and paracervical block  EBL:  MIN   BLOOD ADMINISTERED:none  DRAINS: none   LOCAL MEDICATIONS USED:  NONE  SPECIMEN:  No Specimen  DISPOSITION OF SPECIMEN:  N/A  COUNTS:  YES  TOURNIQUET:   Total Tourniquet Time Documented: Upper Arm (Left) - 65 minutes Total: Upper Arm (Left) - 65 minutes   DICTATION: .[name] Dictation  PLAN OF CARE: Discharge to home after PACU  PATIENT DISPOSITION:  PACU - hemodynamically stable.   Delay start of Pharmacological VTE agent (>24hrs) due to surgical blood loss or risk of bleeding: not applicable

## 2020-09-11 NOTE — Progress Notes (Signed)
Dr Aline Brochure at bedside to talk with pt. Cleared for d/c home.

## 2020-09-11 NOTE — Anesthesia Preprocedure Evaluation (Signed)
Anesthesia Evaluation  Patient identified by MRN, date of birth, ID band Patient awake    Reviewed: Allergy & Precautions, NPO status , Patient's Chart, lab work & pertinent test results  Airway Mallampati: II  TM Distance: >3 FB Neck ROM: Full    Dental  (+) Dental Advisory Given, Teeth Intact   Pulmonary    Pulmonary exam normal breath sounds clear to auscultation       Cardiovascular Exercise Tolerance: Good Normal cardiovascular exam Rate:Normal     Neuro/Psych PSYCHIATRIC DISORDERS Depression    GI/Hepatic negative GI ROS, Neg liver ROS,   Endo/Other    Renal/GU negative Renal ROS     Musculoskeletal LEFT WRIST FX   Abdominal   Peds  Hematology   Anesthesia Other Findings   Reproductive/Obstetrics                            Anesthesia Physical Anesthesia Plan  ASA: I  Anesthesia Plan: General and Regional   Post-op Pain Management:  Regional for Post-op pain   Induction: Intravenous  PONV Risk Score and Plan: Ondansetron, Dexamethasone and Midazolam  Airway Management Planned: Nasal Cannula, Natural Airway and Simple Face Mask  Additional Equipment:   Intra-op Plan:   Post-operative Plan: Extubation in OR  Informed Consent: I have reviewed the patients History and Physical, chart, labs and discussed the procedure including the risks, benefits and alternatives for the proposed anesthesia with the patient or authorized representative who has indicated his/her understanding and acceptance.     Dental advisory given  Plan Discussed with: CRNA and Surgeon  Anesthesia Plan Comments: (Possible LMA vs GETA vs TIVA)       Anesthesia Quick Evaluation

## 2020-09-11 NOTE — Anesthesia Procedure Notes (Signed)
Anesthesia Regional Block: Supraclavicular block   Pre-Anesthetic Checklist: ,, timeout performed, Correct Patient, Correct Site, Correct Laterality, Correct Procedure, Correct Position, site marked, Risks and benefits discussed, at surgeon's request and post-op pain management  Laterality: Upper and Left  Prep: chloraprep       Needles:  Injection technique: Single-shot  Needle Type: Echogenic Stimulator Needle     Needle Length: 8.3cm  Needle Gauge: 22     Additional Needles:   Procedures:,,,, ultrasound used (permanent image in chart),,,,  Narrative:  Start time: 09/11/2020 12:35 PM End time: 09/11/2020 12:46 PM  Performed by: Personally  Anesthesiologist: Denese Killings, MD  Additional Notes: Block assessed prior to start of surgery

## 2020-09-11 NOTE — Anesthesia Postprocedure Evaluation (Signed)
Anesthesia Post Note  Patient: Julie Cochran  Procedure(s) Performed: OPEN REDUCTION INTERNAL FIXATION (ORIF) WRIST FRACTURE (Left Wrist)  Patient location during evaluation: PACU Anesthesia Type: Regional and MAC Level of consciousness: awake, oriented, awake and alert and patient cooperative Pain management: pain level controlled Vital Signs Assessment: post-procedure vital signs reviewed and stable Respiratory status: spontaneous breathing, respiratory function stable and nonlabored ventilation Cardiovascular status: blood pressure returned to baseline and stable Postop Assessment: no headache and no backache Anesthetic complications: no   No complications documented.   Last Vitals:  Vitals:   09/11/20 1151  BP: (!) 148/67  Pulse: 66  Resp: 18  Temp: 36.9 C  SpO2: 100%    Last Pain:  Vitals:   09/11/20 1151  TempSrc: Oral  PainSc: 10-Worst pain ever                 Tacy Learn

## 2020-09-11 NOTE — H&P (Signed)
  Patient ID: Julie Cochran, female   DOB: Nov 03, 1966, 54 y.o.   MRN: [id number]      Chief Complaint  Patient presents with  . Wrist Injury    09/08/20 left wrist    54 year old female status post closed injury fall on outstretched hand left wrist.  Presents with a displaced shortened angulated left distal radius fracture.  Recommended treatment is closed reduction pinning versus open reduction internal fixation.  Patient has opted for   HPI Julie Cochran is a 54 y.o. female.   54 year old female with vitamin D deficiency history has a left distal radius fracture with displacement angulation and shortening  Complains of left wrist pain  ER records indicate patient fell backwards trying to pull some dead trees out of the ground.  Treatment included over-the-counter medications and splinting  Review of Systems Review of Systems  Cardiovascular: Negative for chest pain.  All other systems reviewed and are negative.        Past Medical History:  Diagnosis Date  . Depression   . HSV-1 (herpes simplex virus 1) infection   . Vitamin D deficiency          Past Surgical History:  Procedure Laterality Date  . ABDOMINAL HYSTERECTOMY  2014   Ronald Neal,MD. 7/14  . BREAST BIOPSY Right   . NOVASURE ABLATION    . TONSILLECTOMY AND ADENOIDECTOMY        Physical Exam 1 Last menstrual period 05/31/2013. Physical Exam 2 The patient is well developed well nourished and well groomed. 3 Orientation to person place and time is normal  4 Mood is pleasant.  5 Ambulatory status normal   6 Inspection of the left  wrist reveals MILD TO MOD  tenderness   MILD swelling MILD  deformity 7 Range of motion assessment: The range of motion is diminished primarily secondary to pain 8 Stability tests are deferred because of pain but the x-ray shows no subluxation of the joint 9 Strength assessment muscle tone is normal resistance testing is deferred because of  pain and swelling  10 Nerve function NORMAL  11 Vascular function NORMAL  12 Local lymphatic system NEG   Opposite extremity RUE  there is no alignment abnormality, no contracture, no subluxation, no atrophy and neurovascular exam is intact  MEDICAL DECISION MAKING  A.      Encounter Diagnosis  Name Primary?  . Colles' fracture of left radius, initial encounter for closed fracture Yes    B. DATA ANALYSED:    IMAGING: Independent interpretation of images: YES SHORT DORSAL ANGUL D RAD FX   Orders: surgery   Outside records reviewed: YES ER   C. MANAGEMENT   SURGERY RECOMMENDED  CRPP VS ORIF LEFT WRIST   OOW 2 WEEKS THEN RE ASSESS  The procedure has been fully reviewed with the patient; The risks and benefits of surgery have been discussed and explained and understood. Alternative treatment has also been reviewed, questions were encouraged and answered. The postoperative plan is also been reviewed.

## 2020-09-11 NOTE — Progress Notes (Signed)
Awake. Needs to void. Placed on bedpan. Voided without difficulty.

## 2020-09-11 NOTE — Brief Op Note (Signed)
09/11/2020  2:30 PM  PATIENT:  Julie Cochran  54 y.o. female  PRE-OPERATIVE DIAGNOSIS:  left wrist fracture  POST-OPERATIVE DIAGNOSIS:  left wrist fracture  PROCEDURE:  Procedure(s): OPEN REDUCTION INTERNAL FIXATION (ORIF) WRIST FRACTURE (Left)  DVR PLATE   8 IMPLANT SCREWS   54 year old female who presented with a distal radius fracture with displacement options were nonoperative versus operative treatment.  Operative treatment was chosen.  The patient was seen in preop chart review was completed wrist and hand reexamined, everything look good for surgery and she was taken to the operating room after she got a supraclavicular block by anesthesia department  She was given general anesthesia as well and her left arm was prepped and draped sterilely.  After timeout was taken the fracture was evaluated and found to be too unstable for attempted closed reduction so an open reduction was performed  We did a volar approach of [name] with skin incision identification of FCR tendon opening of the sheath retraction ulnarly finding the space between the flexor tendons and the pronator quadratus followed by L-shaped incision and subperiosteal dissection of the distal radius.  A periosteal elevator was placed in the fracture site and the fracture was levered distally to gain length and then flexion was used to restore volar tilt  K wire was placed through a small stab incision to hold the fracture and then x-ray was obtained.  Fracture was reduced  We proceeded to place the DVR plate using the oblong hole drilling and placing the screw after appropriate measurement.  We adjusted the plate using the x-ray as guidance and then serially starting from the ulnar proximal side place the appropriate screw starting with threaded pegs to like the plate to the bone and then distally we used smooth pegs we did not use the last screw hole.  We then placed 2 more screws in the cortical bone  Final images show  fracture reduction and appropriate hardware position  The wound was irrigated and closed with 2-0 Monocryl subcuticular stitch followed by benzoin Steri-Strips dressings and volar splint    SURGEON:  Surgeon(s) and Role:    * Carole Civil, MD - Primary  PHYSICIAN ASSISTANT:   ASSISTANTS: DEBI DALLAS   ANESTHESIA:   general and paracervical block  EBL:  MIN   BLOOD ADMINISTERED:none  DRAINS: none   LOCAL MEDICATIONS USED:  NONE  SPECIMEN:  No Specimen  DISPOSITION OF SPECIMEN:  N/A  COUNTS:  YES  TOURNIQUET:   Total Tourniquet Time Documented: Upper Arm (Left) - 65 minutes Total: Upper Arm (Left) - 65 minutes   DICTATION: .[name] Dictation  PLAN OF CARE: Discharge to home after PACU  PATIENT DISPOSITION:  PACU - hemodynamically stable.   Delay start of Pharmacological VTE agent (>24hrs) due to surgical blood loss or risk of bleeding: not applicable

## 2020-09-12 ENCOUNTER — Telehealth: Payer: Self-pay | Admitting: Orthopedic Surgery

## 2020-09-12 ENCOUNTER — Encounter (HOSPITAL_COMMUNITY): Payer: Self-pay | Admitting: Orthopedic Surgery

## 2020-09-12 NOTE — Telephone Encounter (Signed)
Patient called via voice mail, states has question regarding her wrist surgery - please call [phone].

## 2020-09-12 NOTE — Telephone Encounter (Signed)
I called the patient and I gave her the appointment time for next week and she did not have any other questions.

## 2020-09-12 NOTE — Telephone Encounter (Signed)
I left message this morning for her to call me back

## 2020-09-18 ENCOUNTER — Encounter: Payer: Self-pay | Admitting: Orthopedic Surgery

## 2020-09-18 ENCOUNTER — Ambulatory Visit (INDEPENDENT_AMBULATORY_CARE_PROVIDER_SITE_OTHER): Payer: BC Managed Care – PPO | Admitting: Orthopedic Surgery

## 2020-09-18 ENCOUNTER — Other Ambulatory Visit: Payer: Self-pay

## 2020-09-18 DIAGNOSIS — S62102D Fracture of unspecified carpal bone, left wrist, subsequent encounter for fracture with routine healing: Secondary | ICD-10-CM

## 2020-09-18 NOTE — Patient Instructions (Signed)
Brace except bathing and sleeping   Finger hand exercises

## 2020-09-18 NOTE — Progress Notes (Signed)
Post op   Chief Complaint  Patient presents with  . Routine Post Op    wrist ORIF 09/11/20    Postop day 7 status post volar wrist plating.  Patient is doing well moving her fingers well her incision looks good we placed in a wrist brace started active range of motion exercises follow-up next week for postop day 14 x-ray  Encounter Diagnosis  Name Primary?  . Closed fracture of left wrist, with routine healing, subsequent encounter s/p ORIF 09/11/20 Yes

## 2020-09-25 ENCOUNTER — Ambulatory Visit: Payer: BC Managed Care – PPO | Admitting: Orthopedic Surgery

## 2020-09-25 ENCOUNTER — Encounter: Payer: Self-pay | Admitting: Orthopedic Surgery

## 2020-09-25 ENCOUNTER — Ambulatory Visit: Payer: BC Managed Care – PPO

## 2020-09-25 ENCOUNTER — Other Ambulatory Visit: Payer: Self-pay

## 2020-09-25 ENCOUNTER — Ambulatory Visit (INDEPENDENT_AMBULATORY_CARE_PROVIDER_SITE_OTHER): Payer: BC Managed Care – PPO | Admitting: Orthopedic Surgery

## 2020-09-25 VITALS — BP 142/78 | HR 75 | Ht 63.5 in

## 2020-09-25 DIAGNOSIS — S62102D Fracture of unspecified carpal bone, left wrist, subsequent encounter for fracture with routine healing: Secondary | ICD-10-CM

## 2020-09-25 NOTE — Progress Notes (Signed)
POST OP APPT   Chief Complaint  Patient presents with  . Wrist Pain    closed fractuer of left wrist 09/11/20.    POD 14  DVR PLATE LEFT WRIST  1 suture removed   Wound looks great   Moving the fingers well min swelling   Continue exercises and xrays in 5 weeks

## 2020-09-25 NOTE — Patient Instructions (Signed)
Continue your exercises

## 2020-10-08 ENCOUNTER — Telehealth: Payer: Self-pay | Admitting: Orthopedic Surgery

## 2020-10-08 NOTE — Telephone Encounter (Signed)
Yes work her in tomorrow She may have a knot in the stitch

## 2020-10-08 NOTE — Telephone Encounter (Signed)
She is scheduled for tomorrow at 11:40 am

## 2020-10-08 NOTE — Telephone Encounter (Signed)
Patient called and states her wrist has dissolvable stitches  And there one that she can feel over it and it is hard and is hurting. She request to come in tomorrow and get Dr. Aline Brochure to look at it.  Please call patient.

## 2020-10-09 ENCOUNTER — Ambulatory Visit (INDEPENDENT_AMBULATORY_CARE_PROVIDER_SITE_OTHER): Payer: BC Managed Care – PPO | Admitting: Orthopedic Surgery

## 2020-10-09 ENCOUNTER — Other Ambulatory Visit: Payer: Self-pay

## 2020-10-09 DIAGNOSIS — S62102D Fracture of unspecified carpal bone, left wrist, subsequent encounter for fracture with routine healing: Secondary | ICD-10-CM

## 2020-10-09 NOTE — Progress Notes (Signed)
Chief Complaint  Patient presents with  . Follow-up    Recheck on left wrist, DOS 09-11-20.  \ 54 year old female had an ORIF of the left wrist complains of a stitch at the end of the incision and sensitivity over the end of the incision  Exam shows that the area at the end of the incision there is a little piece of suture there is an absorbable suture it was removed with sterile technique and ethyl chloride however the area still sensitive  I do not see signs of infection she has excellent motion of all the digits except the thumb which she can get to the middle and ring finger just not to the small finger very well  Recommend she apply a doughnut over the area of sensitivity continue the Neosporin follow-up for the x-ray as scheduled

## 2020-10-09 NOTE — Patient Instructions (Signed)
Keep moving the fingers and the thumb   Apply donut over the sensitive area of the wound   Keep appt

## 2020-10-28 ENCOUNTER — Telehealth: Payer: Self-pay | Admitting: Orthopedic Surgery

## 2020-10-28 NOTE — Telephone Encounter (Signed)
Called back to patient following her drop off of letter and form from [employer] regarding her current short-term disability claim. Concern was that her disability payment was "300.00 less"; therefore, patient brought back information which patient requests to be provided.  has since found out

## 2020-10-30 ENCOUNTER — Other Ambulatory Visit: Payer: Self-pay

## 2020-10-30 ENCOUNTER — Encounter: Payer: Self-pay | Admitting: Orthopedic Surgery

## 2020-10-30 ENCOUNTER — Ambulatory Visit (INDEPENDENT_AMBULATORY_CARE_PROVIDER_SITE_OTHER): Payer: BC Managed Care – PPO | Admitting: Orthopedic Surgery

## 2020-10-30 ENCOUNTER — Ambulatory Visit (INDEPENDENT_AMBULATORY_CARE_PROVIDER_SITE_OTHER): Payer: BC Managed Care – PPO

## 2020-10-30 DIAGNOSIS — S62102D Fracture of unspecified carpal bone, left wrist, subsequent encounter for fracture with routine healing: Secondary | ICD-10-CM

## 2020-10-30 NOTE — Progress Notes (Signed)
Chief Complaint  Patient presents with  . Routine Post Op    left wrist ORIF 09/11/20    8 weeks postop volar wrist plating patient is doing very well think she can go back to typing.  The only problem she has is pain at night in the left thumb  Her incision is clean dry and intact healed well her x-ray shows fracture is healing well also  She will start some ibuprofen 800 mg at night follow-up in 4 to 5 weeks  Should return to work typing only no lifting  Encounter Diagnosis  Name Primary?  . Closed fracture of left wrist, with routine healing, subsequent encounter s/p ORIF 09/11/20 Yes

## 2020-10-30 NOTE — Patient Instructions (Addendum)
Squeeze a stress ball or tennis ball to increase strength Take ibuprofen before bed Ok to use one pound weight, increase when not painful  Continue to ice the wrist/ heat is okay if it feels better   May return to work to type only no lifting at all x 8 wekes

## 2020-12-04 ENCOUNTER — Ambulatory Visit (INDEPENDENT_AMBULATORY_CARE_PROVIDER_SITE_OTHER): Payer: BC Managed Care – PPO | Admitting: Orthopedic Surgery

## 2020-12-04 ENCOUNTER — Other Ambulatory Visit: Payer: Self-pay

## 2020-12-04 DIAGNOSIS — S62102D Fracture of unspecified carpal bone, left wrist, subsequent encounter for fracture with routine healing: Secondary | ICD-10-CM

## 2020-12-04 NOTE — Progress Notes (Signed)
Chief Complaint  Patient presents with  . Follow-up    Recheck on left wrist, DOS 09-11-20.    ~ 3 months post op   All issues resolved   She has no complaints   She has full range of motion scar looks good no tenderness no swelling good grip  Resume normal activities follow-up as needed

## 2020-12-09 ENCOUNTER — Ambulatory Visit
Admission: RE | Admit: 2020-12-09 | Discharge: 2020-12-09 | Disposition: A | Discharge status: Home / Self Care | Payer: BC Managed Care – PPO | Source: Ambulatory Visit | Attending: Adult Health | Admitting: Adult Health

## 2020-12-09 ENCOUNTER — Other Ambulatory Visit: Payer: Self-pay

## 2020-12-09 DIAGNOSIS — Z1231 Encounter for screening mammogram for malignant neoplasm of breast: Secondary | ICD-10-CM | POA: Diagnosis not present

## 2021-02-03 IMAGING — MG DIGITAL SCREENING BILAT W/ TOMO W/ CAD
8 series · 8 of 24 positions shown · non-contrast
Comparison: Previous exam(s).

CLINICAL DATA: Screening.

EXAM:
DIGITAL SCREENING BILATERAL MAMMOGRAM WITH TOMO AND CAD

[R CC synth-2D]
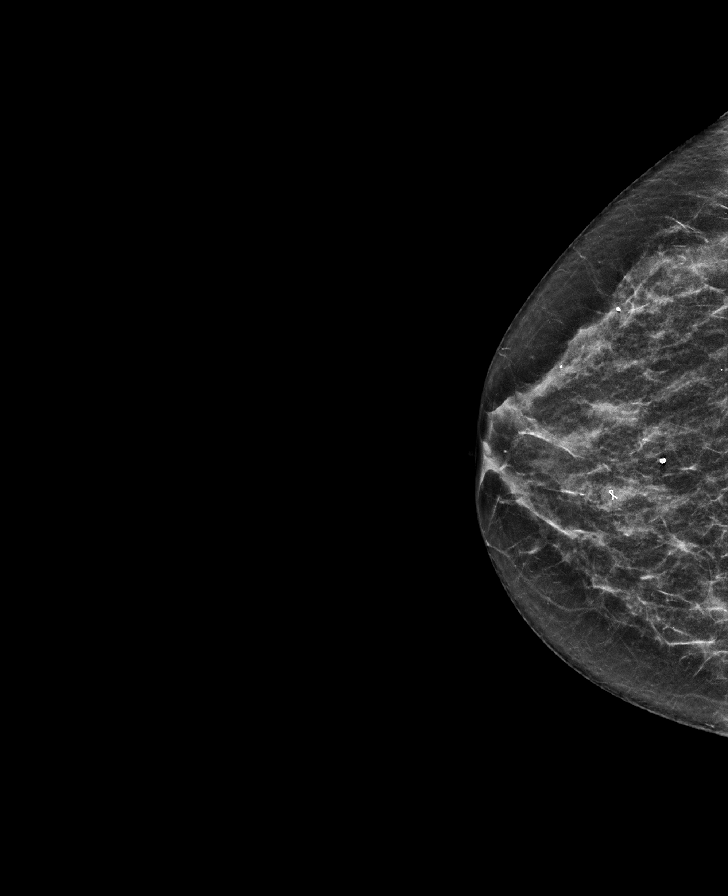

[R MLO synth-2D]
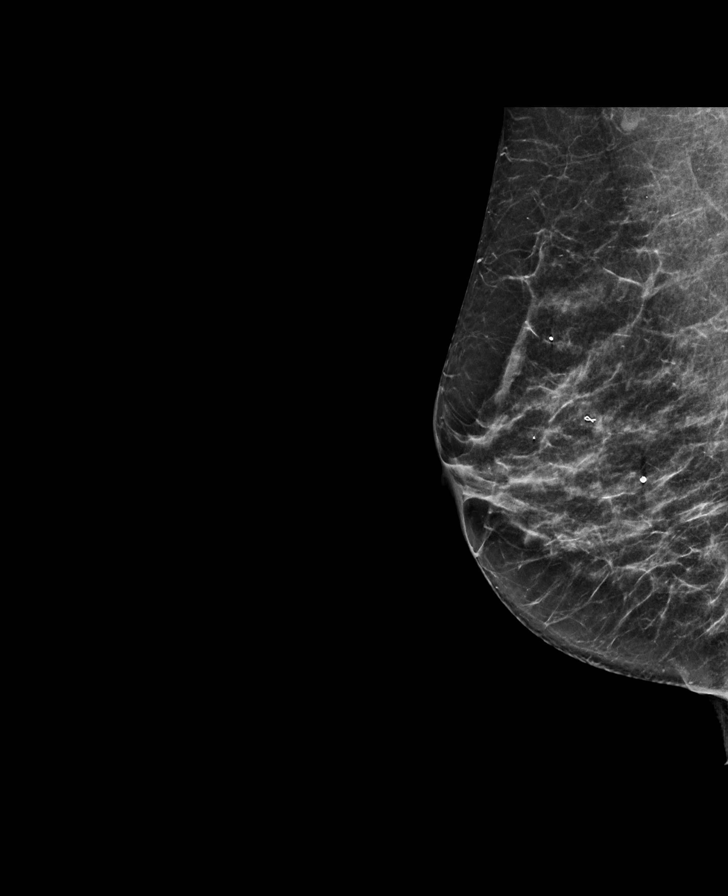

[L MLO synth-2D]
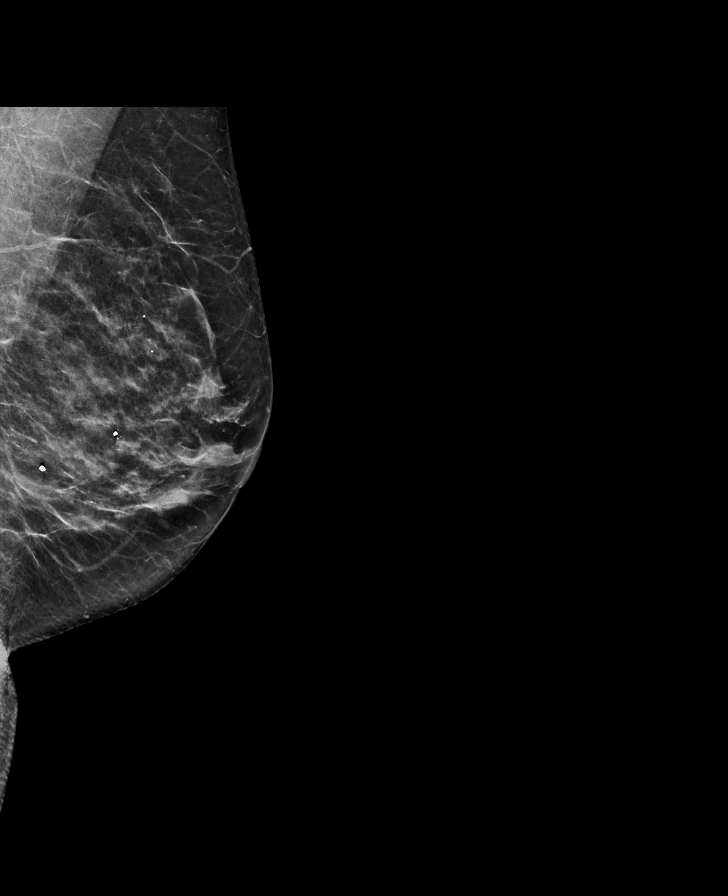

[L CC synth-2D]
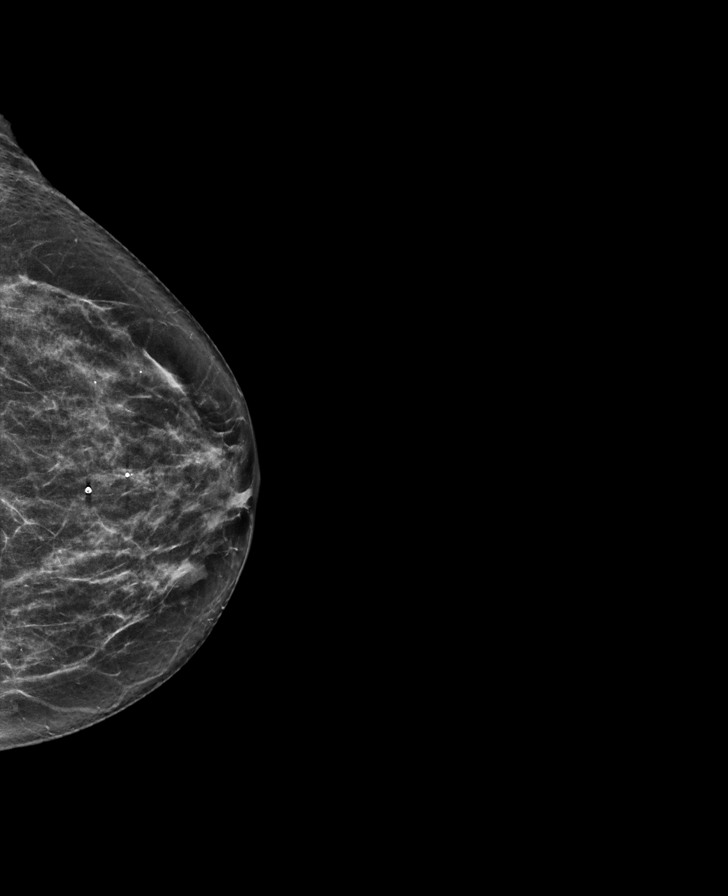

[L MLO tomo · tomo slice 33/64.0]
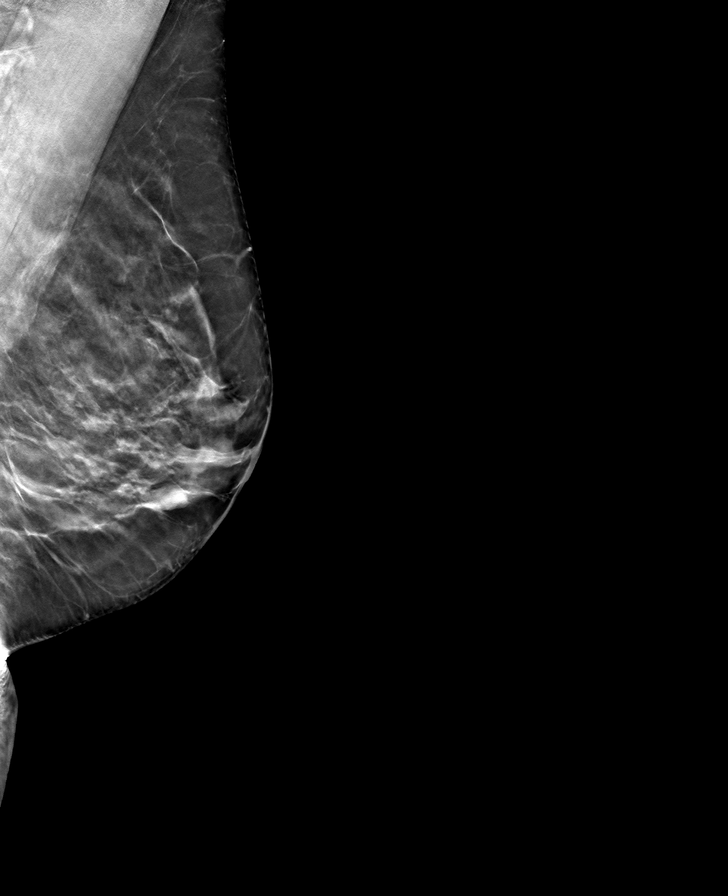

[R MLO tomo · tomo slice 31/60.0]
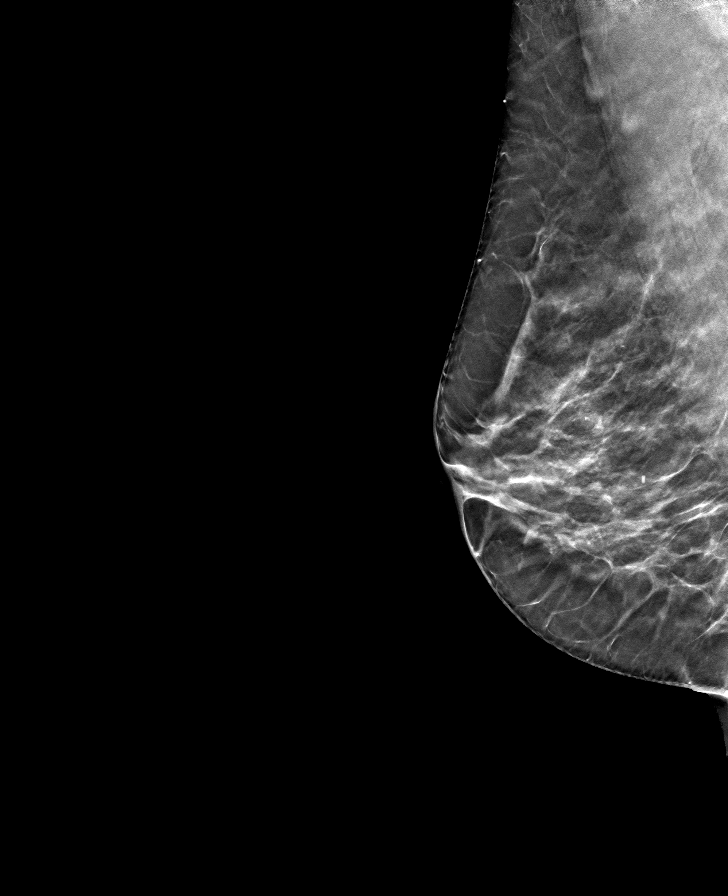

[R CC tomo · tomo slice 30/59.0]
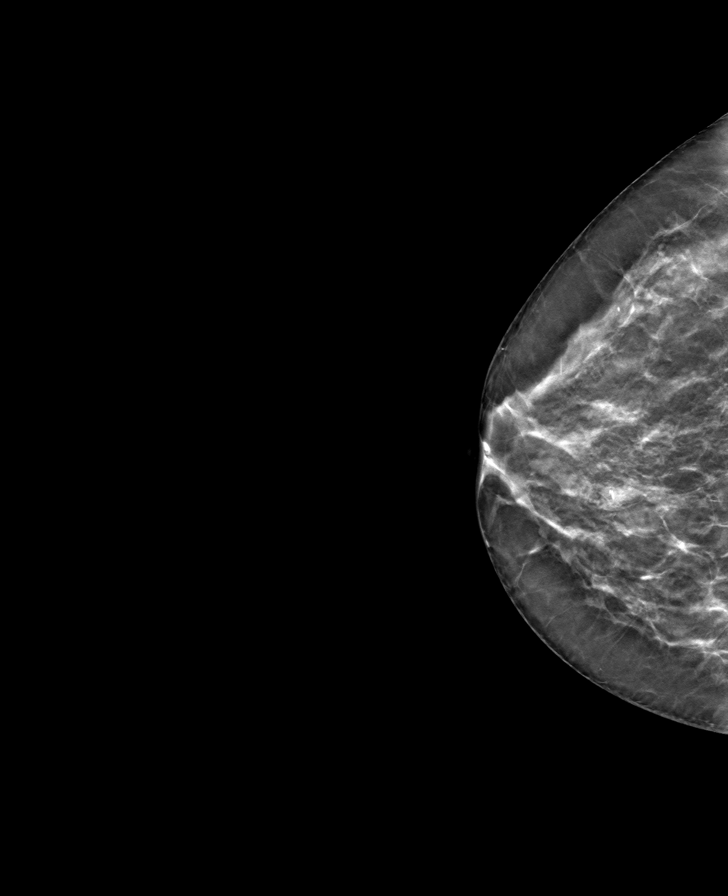

[L CC tomo · tomo slice 31/61.0]
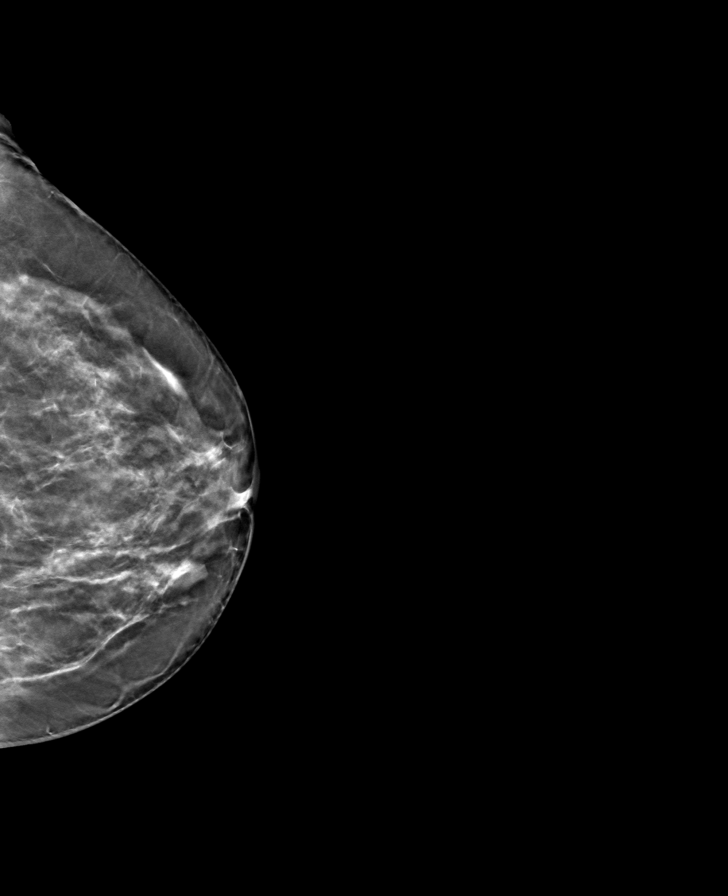

[8 of 24 positions shown; findings below may reference images not displayed]

ACR Breast Density Category c: The breast tissue is heterogeneously
dense, which may obscure small masses.
FINDINGS: There are no findings suspicious for malignancy. Images were
processed with CAD.
IMPRESSION: No mammographic evidence of malignancy. A result letter of this
screening mammogram will be mailed directly to the patient.

RECOMMENDATION:
Screening mammogram in one year. (Code:[id number])

BI-RADS CATEGORY  1: Negative.

## 2021-07-09 ENCOUNTER — Encounter: Payer: Self-pay | Admitting: Adult Health

## 2021-07-09 NOTE — Progress Notes (Signed)
Complete Physical  Assessment and Plan:  Routine general medical examination at a health care facility Due annually  Health Maintenance reviewed Healthy lifestyle reviewed and goals set -     CBC with Differential/Platelet -     CMP/GFR -     Urinalysis, Routine w reflex microscopic (not at [facility])  HSV-1 and HSV 2  infection        - Valtrex PRN for flares   Depression - in remission   Vitamin D deficiency  - continue supplement   Dense breast tissue/fibroadenoma  - get 3D MGM annually   Need for pneumonia vaccination - 23 valent pneumococcal vaccine administered without complication today   Screening colon cancer - patient declines a colonoscopy even though the risks and benefits were discussed at length. Colon cancer is 3rd most diagnosed cancer and 2nd leading cause of death in both men and women 54 years of age and older. Patient understands the risk of cancer and death with declining the test however they are willing to do cologuard screening instead. They understand that this is not as sensitive or specific as a colonoscopy and they are still recommended to get a colonoscopy. The cologuard will be sent out to their house.    Orders Placed This Encounter  Procedures   CBC with Differential/Platelet   COMPLETE METABOLIC PANEL WITH GFR   Lipid panel   TSH   Hemoglobin A1c   VITAMIN D 25 Hydroxy (Vit-D Deficiency, Fractures)   Urinalysis, Routine w reflex microscopic   Cologuard   Discussed med's effects and SE's. Screening labs and tests as requested with regular follow-up as recommended.  Future Appointments  Date Time Provider Goodnight  54/14/2023 10:00 AM Julie Cochran, Julie Cochran [facility] None     HPI 55 y.o. DAA female  presents for a complete physical. She has HSV-1 (herpes simplex virus 1) infection; Depression; Vitamin D deficiency; Medication management; Dense breast tissue; and Overweight (BMI 25.0-29.9) on their problem list.   Has custody of  granddaughter, Julie Cochran, will be in 7th grade this year.  Divorced, single and happy. Works remotely in [employer] under[employer] and loving working from home except 2 days/week. S/p hysterectomy, no longer following with GYN. She gets annual 3D mammograms via breast center.   No concerns.   Last year she had L wrist fracture with ORIF by Dr. Arther Cochran 09/11/2020, doing very well since then.   Has history of HSV1 and HSV2, last outbreak 3 months ago, has 2-3/year, takes   BMI is Body mass index is 27.44 kg/m., she admits hasn't kept up with diet and exercise. She does have 54 y/o husky mix that keeps her busy. 2 cup coffee daily.  Quit soda, but admits not much water, lots of coffee.  Admits to very poor fruit/veggies,  Eats mostly meat and potatoes but working to do better Sleeps well, 8+.  Exercise- very active with new puppy daily, hour+ Wt Readings from Last 3 Encounters:  07/10/21 157 lb 6.4 oz (71.4 kg)  09/09/20 146 lb (66.2 kg)  07/10/20 150 lb (68 kg)   Her blood pressure has been controlled at home, today their BP is BP: 120/76 She does workout. She denies chest pain, shortness of breath, dizziness.   She is not on cholesterol medication and denies myalgias. Her cholesterol is at goal. The cholesterol last visit was:   Lab Results  Component Value Date   CHOL 166 07/10/2020   HDL 72 07/10/2020   LDLCALC 80 07/10/2020   TRIG  56 07/10/2020   CHOLHDL 2.3 07/10/2020   Last A1C in the office was:  Lab Results  Component Value Date   HGBA1C 5.2 07/10/2019   Lab Results  Component Value Date   GFRNONAA >60 09/10/2020   Patient is on Vitamin D supplement, taking 10000 IU daily: Lab Results  Component Value Date   VD25OH 31 07/10/2020      Current Medications:  Current Outpatient Medications on File Prior to Visit  Medication Sig   Biotin 1000 MCG tablet Take 1,000 mcg by mouth daily.    Cholecalciferol (VITAMIN D3) 20 MCG (800 UNIT) TABS Take 1,600 Units by mouth daily in  the afternoon.   Garcinia Cambogia-Chromium 500-200 MG-MCG TABS Take 1 tablet by mouth daily in the afternoon.   ibuprofen (ADVIL) 800 MG tablet Take 1 tablet (800 mg total) by mouth every 8 (eight) hours as needed.   Lysine 500 MG CAPS Take 500 mg by mouth daily.    Multiple Vitamin (MULTIVITAMIN) tablet Take 1 tablet by mouth daily. Woman's   No current facility-administered medications on file prior to visit.    Health Maintenance:   Immunization History  Administered Date(s) Administered   Influenza Split 10/18/2018   Influenza,inj,Quad PF,6+ Mos 09/10/2019   Influenza-Unspecified 10/18/2013, 10/17/2014, 08/27/2015, 09/10/2019   Moderna Sars-Covid-2 Vaccination 08/24/2020, 09/22/2020, 03/10/2021   Td 07/06/2018   Tdap 02/08/2008   Zoster Recombinat (Shingrix) 07/12/2019, 07/12/2019, 09/10/2019, 09/10/2019   Tetanus: 2019 Pneumovax: Today  Prevnar 13: N/A Flu vaccine: 2021 at work Shingrix: 2/2 2020 Covid 19: 2/2 + booster  Pap: 04/2013, had TAH 06/2013 - none further needed per GYN US Pelvis: 05/2013  3DMGM: 12/09/2020 DEXA: N/A  Colonoscopy: never Cologuard: 07/2018 negative due this year ordered -  EGD: N/A  DEE: Dr. Sabra Heck, glasses, 05/2019, plans to schedule Dentist: [facility], last visit 2022, goes q9m   Medical History:  Past Medical History:  Diagnosis Date   Closed fracture of left wrist, with routine healing, subsequent encounter s/p ORIF 09/11/20    Depression    HSV-1 (herpes simplex virus 1) infection    PONV (postoperative nausea and vomiting)    Vitamin D deficiency   Allergies No Known Allergies  SURGICAL HISTORY She  has a past surgical history that includes Tonsillectomy and adenoidectomy; Novasure ablation; Abdominal hysterectomy (2014); Breast biopsy (Right); and ORIF wrist fracture (Left, 09/11/2020). FAMILY HISTORY Her family history includes Breast cancer in her maternal grandmother; Leukemia in her paternal aunt. SOCIAL  HISTORY She  reports that she has never smoked. She has never used smokeless tobacco. She reports that she does not drink alcohol and does not use drugs.   Review of Systems  Constitutional: Negative.  Negative for malaise/fatigue and weight loss.  HENT: Negative.  Negative for hearing loss and tinnitus.   Eyes: Negative.  Negative for blurred vision and double vision.  Respiratory: Negative.  Negative for cough, sputum production, shortness of breath and wheezing.   Cardiovascular: Negative.  Negative for chest pain, palpitations, orthopnea, claudication, leg swelling and PND.  Gastrointestinal: Negative.  Negative for abdominal pain, blood in stool, constipation, diarrhea, heartburn, melena, nausea and vomiting.  Genitourinary: Negative.   Musculoskeletal:  Negative for falls, joint pain and myalgias.  Skin: Negative.  Negative for rash.  Neurological: Negative.  Negative for dizziness, tingling, sensory change, weakness and headaches.  Endo/Heme/Allergies: Negative.  Negative for polydipsia.  Psychiatric/Behavioral: Negative.  Negative for depression, memory loss, substance abuse and suicidal ideas. The patient is not nervous/anxious  and does not have insomnia.   All other systems reviewed and are negative.  Physical Exam: Estimated body mass index is 27.44 kg/m as calculated from the following:   Height as of this encounter: 5' 3.5" (1.613 m).   Weight as of this encounter: 157 lb 6.4 oz (71.4 kg). BP 120/76   Pulse 69   Temp 97.7 F (36.5 C)   Ht 5' 3.5" (1.613 m)   Wt 157 lb 6.4 oz (71.4 kg)   LMP 05/31/2013   SpO2 99%   BMI 27.44 kg/m  General Appearance: Well nourished, in no apparent distress. Eyes: PERRLA, EOMs, conjunctiva no swelling or erythema Sinuses: No Frontal/maxillary tenderness ENT/Mouth: Ext aud canals clear, normal light reflex with TMs without erythema, bulging.  Good dentition. No erythema, swelling, or exudate on post pharynx. Tonsils not swollen or  erythematous. Hearing normal.  Neck: Supple, thyroid normal. No bruits Respiratory: Respiratory effort normal, BS equal bilaterally without rales, rhonchi, wheezing or stridor. Cardio: RRR without murmurs, rubs or gallops. Brisk peripheral pulses without edema.  Breasts: Getting annual mammograms, regular self checks, no concerns, defer Abdomen: Soft, +BS. Non tender, no guarding, rebound, hernias, masses, or organomegaly. .  Lymphatics: Non tender without lymphadenopathy.  Genitourinary: defer Musculoskeletal: Full ROM all peripheral extremities,5/5 strength, and normal gait. Skin: Tattoo on left lateral ankle. Warm, dry without rashes, lesions, ecchymosis. Well healed surgical scar to left wrist.  Neuro: Cranial nerves intact, reflexes equal bilaterally. Normal muscle tone, no cerebellar symptoms. Sensation intact.  Psych: Awake and oriented X 3, normal affect, Insight and Judgment appropriate.   EKG:  WNL 2020 reviewed, low risk, defer  Julie Ribas, Julie Cochran 10:05 AM [facility]

## 2021-07-10 ENCOUNTER — Other Ambulatory Visit: Payer: Self-pay

## 2021-07-10 ENCOUNTER — Encounter: Payer: Self-pay | Admitting: Adult Health

## 2021-07-10 ENCOUNTER — Ambulatory Visit (INDEPENDENT_AMBULATORY_CARE_PROVIDER_SITE_OTHER): Payer: BC Managed Care – PPO | Admitting: Adult Health

## 2021-07-10 VITALS — BP 120/76 | HR 69 | Temp 97.7°F | Ht 63.5 in | Wt 157.4 lb

## 2021-07-10 DIAGNOSIS — Z131 Encounter for screening for diabetes mellitus: Secondary | ICD-10-CM

## 2021-07-10 DIAGNOSIS — Z23 Encounter for immunization: Secondary | ICD-10-CM

## 2021-07-10 DIAGNOSIS — Z1389 Encounter for screening for other disorder: Secondary | ICD-10-CM | POA: Diagnosis not present

## 2021-07-10 DIAGNOSIS — Z0001 Encounter for general adult medical examination with abnormal findings: Secondary | ICD-10-CM

## 2021-07-10 DIAGNOSIS — E559 Vitamin D deficiency, unspecified: Secondary | ICD-10-CM | POA: Diagnosis not present

## 2021-07-10 DIAGNOSIS — E663 Overweight: Secondary | ICD-10-CM

## 2021-07-10 DIAGNOSIS — Z1329 Encounter for screening for other suspected endocrine disorder: Secondary | ICD-10-CM

## 2021-07-10 DIAGNOSIS — B009 Herpesviral infection, unspecified: Secondary | ICD-10-CM

## 2021-07-10 DIAGNOSIS — F3342 Major depressive disorder, recurrent, in full remission: Secondary | ICD-10-CM

## 2021-07-10 DIAGNOSIS — Z0000 Encounter for general adult medical examination without abnormal findings: Secondary | ICD-10-CM | POA: Diagnosis not present

## 2021-07-10 DIAGNOSIS — Z13220 Encounter for screening for lipoid disorders: Secondary | ICD-10-CM | POA: Diagnosis not present

## 2021-07-10 DIAGNOSIS — Z79899 Other long term (current) drug therapy: Secondary | ICD-10-CM

## 2021-07-10 DIAGNOSIS — Z1211 Encounter for screening for malignant neoplasm of colon: Secondary | ICD-10-CM

## 2021-07-10 DIAGNOSIS — R922 Inconclusive mammogram: Secondary | ICD-10-CM

## 2021-07-10 MED ORDER — VALACYCLOVIR HCL 1 G PO TABS: ORAL_TABLET | ORAL | 1 refills | Status: DC

## 2021-07-10 NOTE — Addendum Note (Signed)
Addended by: Chancy Hurter on: 07/10/2021 02:07 PM   Modules accepted: Orders

## 2021-07-10 NOTE — Patient Instructions (Addendum)
Ms. Brossman , Thank you for taking time to come for your Annual Wellness Visit. I appreciate your ongoing commitment to your health goals. Please review the following plan we discussed and let me know if I can assist you in the future.   These are the goals we discussed:  Goals      DIET - EAT MORE PLANTS     Aim for whole grains, beans, nuts/seeds, variety of fruits/veggies daily     DIET - INCREASE WATER INTAKE     65+ fluid ounces, avoid daily soda     Exercise 150 min/wk Moderate Activity     Try resistance/weight bearing exercises 3 days a week for muscle and bone health in menopause.      Weight (lb) < 145 lb (65.8 kg)        This is a list of the screening recommended for you and due dates:  Health Maintenance  Topic Date Due   Pneumococcal Vaccination (1 - PCV) Never done   COVID-19 Vaccine (4 - Booster for Moderna series) 06/10/2021   Flu Shot  07/28/2021   Cologuard (Stool DNA test)  08/02/2021   Mammogram  12/09/2022   Tetanus Vaccine  07/06/2028   Hepatitis C Screening: USPSTF Recommendation to screen - Ages 18-79 yo.  Completed   HIV Screening  Completed   Zoster (Shingles) Vaccine  Completed   HPV Vaccine  Aged Out      Know what a healthy weight is for you (roughly BMI <25) and aim to maintain this  Aim for 7+ servings of fruits and vegetables daily  65-80+ fluid ounces of water or unsweet tea for healthy kidneys  Limit to max 1 drink of alcohol per day; avoid smoking/tobacco  Limit animal fats in diet for cholesterol and heart health - choose grass fed whenever available  Avoid highly processed foods, and foods high in saturated/trans fats  Aim for low stress - take time to unwind and care for your mental health  Aim for 150 min of moderate intensity exercise weekly for heart health, and weights twice weekly for bone health  Aim for 7-9 hours of sleep daily     High-Fiber Eating Plan Fiber, also called dietary fiber, is a type of  carbohydrate. It is found foods such as fruits, vegetables, whole grains, and beans. A high-fiber diet can have many health benefits. Your health care provider may recommend a high-fiber diet to help: Prevent constipation. Fiber can make your bowel movements more regular. Lower your cholesterol. Relieve the following conditions: Inflammation of veins in the anus (hemorrhoids). Inflammation of specific areas of the digestive tract (uncomplicated diverticulosis). A problem of the large intestine, also called the colon, that sometimes causes pain and diarrhea (irritable bowel syndrome, or IBS). Prevent overeating as part of a weight-loss plan. Prevent heart disease, type 2 diabetes, and certain cancers. What are tips for following this plan? Reading food labels  Check the nutrition facts label on food products for the amount of dietary fiber. Choose foods that have 5 grams of fiber or more per serving. The goals for recommended daily fiber intake include: Men (age 70 or younger): 34-38 g. Men (over age 38): 28-34 g. Women (age 40 or younger): 25-28 g. Women (over age 2): 22-25 g. Your daily fiber goal is _____________ g. Shopping Choose whole fruits and vegetables instead of processed forms, such as apple juice or applesauce. Choose a wide variety of high-fiber foods such as avocados, lentils, oats, and kidney beans.  Read the nutrition facts label of the foods you choose. Be aware of foods with added fiber. These foods often have high sugar and sodium amounts per serving. Cooking Use whole-grain flour for baking and cooking. Cook with brown rice instead of white rice. Meal planning Start the day with a breakfast that is high in fiber, such as a cereal that contains 5 g of fiber or more per serving. Eat breads and cereals that are made with whole-grain flour instead of refined flour or white flour. Eat brown rice, bulgur wheat, or millet instead of white rice. Use beans in place of meat in  soups, salads, and pasta dishes. Be sure that half of the grains you eat each day are whole grains. General information You can get the recommended daily intake of dietary fiber by: Eating a variety of fruits, vegetables, grains, nuts, and beans. Taking a fiber supplement if you are not able to take in enough fiber in your diet. It is better to get fiber through food than from a supplement. Gradually increase how much fiber you consume. If you increase your intake of dietary fiber too quickly, you may have bloating, cramping, or gas. Drink plenty of water to help you digest fiber. Choose high-fiber snacks, such as berries, raw vegetables, nuts, and popcorn. What foods should I eat? Fruits Berries. Pears. Apples. Oranges. Avocado. Prunes and raisins. Dried figs. Vegetables Sweet potatoes. Spinach. Kale. Artichokes. Cabbage. Broccoli. Cauliflower.Green peas. Carrots. Squash. Grains Whole-grain breads. Multigrain cereal. Oats and oatmeal. Brown rice. Barley.Bulgur wheat. [place]. Quinoa. Bran muffins. Popcorn. Rye wafer crackers. Meats and other proteins Navy beans, kidney beans, and pinto beans. Soybeans. Split peas. Lentils. Nutsand seeds. Dairy Fiber-fortified yogurt. Beverages Fiber-fortified soy milk. Fiber-fortified orange juice. Other foods Fiber bars. The items listed above may not be a complete list of recommended foods and beverages. Contact a dietitian for more information. What foods should I avoid? Fruits Fruit juice. Cooked, strained fruit. Vegetables Fried potatoes. Canned vegetables. Well-cooked vegetables. Grains White bread. Pasta made with refined flour. White rice. Meats and other proteins Fatty cuts of meat. Fried chicken or fried fish. Dairy Milk. Yogurt. Cream cheese. Sour cream. Fats and oils Butters. Beverages Soft drinks. Other foods Cakes and pastries. The items listed above may not be a complete list of foods and beverages to avoid. Talk with your  dietitian about what choices are best for you. Summary Fiber is a type of carbohydrate. It is found in foods such as fruits, vegetables, whole grains, and beans. A high-fiber diet has many benefits. It can help to prevent constipation, lower blood cholesterol, aid weight loss, and reduce your risk of heart disease, diabetes, and certain cancers. Increase your intake of fiber gradually. Increasing fiber too quickly may cause cramping, bloating, and gas. Drink plenty of water while you increase the amount of fiber you consume. The best sources of fiber include whole fruits and vegetables, whole grains, nuts, seeds, and beans. This information is not intended to replace advice given to you by your health care provider. Make sure you discuss any questions you have with your healthcare provider. Document Revised: 04/18/2020 Document Reviewed: 04/18/2020 Elsevier Patient Education  2022 [employer].

## 2021-07-11 LAB — URINALYSIS, ROUTINE W REFLEX MICROSCOPIC
Bacteria, UA: NONE SEEN /HPF
Bilirubin Urine: NEGATIVE
Glucose, UA: NEGATIVE
Hgb urine dipstick: NEGATIVE
Hyaline Cast: NONE SEEN /LPF
Ketones, ur: NEGATIVE
Nitrite: NEGATIVE
Protein, ur: NEGATIVE
RBC / HPF: NONE SEEN /HPF (ref 0–2)
Specific Gravity, Urine: 1.021 (ref 1.001–1.035)
Squamous Epithelial / HPF: NONE SEEN /HPF (ref <=5)
pH: 5.5 (ref 5.0–8.0)

## 2021-07-11 LAB — CBC WITH DIFFERENTIAL/PLATELET
Absolute Monocytes: 385 cells/uL (ref 200–950)
Basophils Absolute: 50 cells/uL (ref 0–200)
Basophils Relative: 0.9 %
Eosinophils Absolute: 99 cells/uL (ref 15–500)
Eosinophils Relative: 1.8 %
HCT: 42.3 % (ref 35.0–45.0)
Hemoglobin: 13.9 g/dL (ref 11.7–15.5)
Lymphs Abs: 2035 cells/uL (ref 850–3900)
MCH: 29.3 pg (ref 27.0–33.0)
MCHC: 32.9 g/dL (ref 32.0–36.0)
MCV: 89.1 fL (ref 80.0–100.0)
MPV: 10.4 fL (ref 7.5–12.5)
Monocytes Relative: 7 %
Neutro Abs: 2932 cells/uL (ref 1500–7800)
Neutrophils Relative %: 53.3 %
Platelets: 258 10*3/uL (ref 140–400)
RBC: 4.75 10*6/uL (ref 3.80–5.10)
RDW: 11.7 % (ref 11.0–15.0)
Total Lymphocyte: 37 %
WBC: 5.5 10*3/uL (ref 3.8–10.8)

## 2021-07-11 LAB — LIPID PANEL
Cholesterol: 163 mg/dL (ref <200)
HDL: 67 mg/dL (ref >=50)
LDL Cholesterol (Calc): 81 mg/dL (calc)
Non-HDL Cholesterol (Calc): 96 mg/dL (calc) (ref <130)
Total CHOL/HDL Ratio: 2.4 (calc) (ref <5.0)
Triglycerides: 73 mg/dL (ref <150)

## 2021-07-11 LAB — COMPLETE METABOLIC PANEL WITH GFR
AG Ratio: 2 (calc) (ref 1.0–2.5)
ALT: 21 U/L (ref 6–29)
AST: 20 U/L (ref 10–35)
Albumin: 4.5 g/dL (ref 3.6–5.1)
Alkaline phosphatase (APISO): 65 U/L (ref 37–153)
BUN: 19 mg/dL (ref 7–25)
CO2: 30 mmol/L (ref 20–32)
Calcium: 10.1 mg/dL (ref 8.6–10.4)
Chloride: 103 mmol/L (ref 98–110)
Creat: 0.91 mg/dL (ref 0.50–1.03)
Globulin: 2.3 g/dL (calc) (ref 1.9–3.7)
Glucose, Bld: 83 mg/dL (ref 65–99)
Potassium: 4.1 mmol/L (ref 3.5–5.3)
Sodium: 140 mmol/L (ref 135–146)
Total Bilirubin: 0.7 mg/dL (ref 0.2–1.2)
Total Protein: 6.8 g/dL (ref 6.1–8.1)
eGFR: 75 mL/min/{1.73_m2} (ref >=60)

## 2021-07-11 LAB — TSH: TSH: 1.13 mIU/L

## 2021-07-11 LAB — HEMOGLOBIN A1C
Hgb A1c MFr Bld: 5.3 % of total Hgb (ref <5.7)
Mean Plasma Glucose: 105 mg/dL
eAG (mmol/L): 5.8 mmol/L

## 2021-07-11 LAB — VITAMIN D 25 HYDROXY (VIT D DEFICIENCY, FRACTURES): Vit D, 25-Hydroxy: 63 ng/mL (ref 30–100)

## 2021-09-24 LAB — COLOGUARD: Cologuard: NEGATIVE

## 2021-10-28 ENCOUNTER — Other Ambulatory Visit: Payer: Self-pay | Admitting: Internal Medicine

## 2021-10-28 DIAGNOSIS — Z1231 Encounter for screening mammogram for malignant neoplasm of breast: Secondary | ICD-10-CM

## 2021-12-10 ENCOUNTER — Other Ambulatory Visit: Payer: Self-pay

## 2021-12-10 ENCOUNTER — Ambulatory Visit
Admission: RE | Admit: 2021-12-10 | Discharge: 2021-12-10 | Disposition: A | Discharge status: Home / Self Care | Payer: BC Managed Care – PPO | Source: Ambulatory Visit | Attending: Internal Medicine | Admitting: Internal Medicine

## 2021-12-10 DIAGNOSIS — Z1231 Encounter for screening mammogram for malignant neoplasm of breast: Secondary | ICD-10-CM | POA: Diagnosis not present

## 2022-05-21 ENCOUNTER — Encounter: Payer: Self-pay | Admitting: Adult Health

## 2022-05-21 ENCOUNTER — Other Ambulatory Visit: Payer: Self-pay | Admitting: Adult Health

## 2022-05-21 DIAGNOSIS — B009 Herpesviral infection, unspecified: Secondary | ICD-10-CM

## 2022-05-21 MED ORDER — VALACYCLOVIR HCL 1 G PO TABS: ORAL_TABLET | ORAL | 1 refills | Status: DC

## 2022-07-09 NOTE — Progress Notes (Signed)
Complete Physical  Assessment and Plan:  Routine general medical examination at a health care facility Due annually  Health Maintenance reviewed Healthy lifestyle reviewed and goals set  HSV-1 and HSV 2  infection        - Valtrex PRN for flares   Depression - in remission   Vitamin D deficiency - continue supplement  - vitamin D level  Dense breast tissue/fibroadenoma  - get 3D MGM annually   Screening diabetes - defer this year, weight stable, was normal last - CMP only  Screening cholesterol  - normal last year, has been working on lifestyle - continue low saturated fat diet, plant heavy high fiber - defer lipid screenig this year after discussion  Screening cardiovascular condition - low risk, screening only every few years - EKG this year   Orders Placed This Encounter  Procedures   CBC with Differential/Platelet   COMPLETE METABOLIC PANEL WITH GFR   Magnesium   VITAMIN D 25 Hydroxy (Vit-D Deficiency, Fractures)   Urinalysis, Routine w reflex microscopic   EKG 12-Lead   Discussed med's effects and SE's. Screening labs and tests as requested with regular follow-up as recommended.  Future Appointments  Date Time Provider [place]  07/13/2023 10:00 AM Darrol Jump, NP [facility] None     HPI 56 y.o. DAA female  presents for a complete physical. She has HSV-1 (herpes simplex virus 1) infection; Depression; Vitamin D deficiency; Medication management; Dense breast tissue; and Overweight (BMI 25.0-29.9) on their problem list.    Divorced, single and happy. Has had sole custody of her granddaughter [name] (starting 8th grade) for many years, but her father recently came back into her life and is now keeping her, this has been a positive transition.   Divorced, single and happy. Works remotely in [employer] under[employer] and loving working from home except 2 days/week.   S/p hysterectomy, no longer following with GYN. She gets annual 3D mammograms via breast  center (last 11/2021), hx of dense breasts.  No concerns.   Has history of HSV1 and HSV2, has 2-3/year, takes valtrex as needed.   BMI is Body mass index is 27.55 kg/m., she reports has improved lifestyle significantly since last year and no longer full time caregiver for granddaughter She has a dog (husky) that keeps her active, but less while very hot 2 cup coffee daily.  Quit soda, doing a lot of water, increased up to 3 bottles of water daily  Has increased more salads and fruit Sleeps well, 8+ hours, low stress.  Wt Readings from Last 3 Encounters:  07/10/22 158 lb (71.7 kg)  07/10/21 157 lb 6.4 oz (71.4 kg)  09/09/20 146 lb (66.2 kg)   Her blood pressure has been controlled at home, today their BP is BP: 118/78 She does workout. She denies chest pain, shortness of breath, dizziness.   She is not on cholesterol medication and denies myalgias. Her cholesterol is at goal. The cholesterol last visit was:   Lab Results  Component Value Date   CHOL 163 07/10/2021   HDL 67 07/10/2021   LDLCALC 81 07/10/2021   TRIG 73 07/10/2021   CHOLHDL 2.4 07/10/2021   Last A1C in the office was:  Lab Results  Component Value Date   HGBA1C 5.3 07/10/2021   Lab Results  Component Value Date   EGFR 75 07/10/2021   Patient is on Vitamin D supplement, taking 10000 IU daily: Lab Results  Component Value Date   VD25OH 63 07/10/2021  Current Medications:  Current Outpatient Medications on File Prior to Visit  Medication Sig   Biotin 1000 MCG tablet Take 1,000 mcg by mouth daily.    CHOLECALCIFEROL PO Take 10,000 Units by mouth daily.   Garcinia Cambogia-Chromium 500-200 MG-MCG TABS Take 1 tablet by mouth daily in the afternoon.   ibuprofen (ADVIL) 800 MG tablet Take 1 tablet (800 mg total) by mouth every 8 (eight) hours as needed.   Lysine 500 MG CAPS Take 500 mg by mouth daily.    Multiple Vitamin (MULTIVITAMIN) tablet Take 1 tablet by mouth daily. Woman's   valACYclovir  (VALTREX) 1000 MG tablet Take 1 tab twice daily for 3-5 days as needed for cold sore breakout.   No current facility-administered medications on file prior to visit.    Health Maintenance:   Immunization History  Administered Date(s) Administered   Influenza Split 10/18/2018   Influenza,inj,Quad PF,6+ Mos 09/10/2019   Influenza-Unspecified 10/18/2013, 10/17/2014, 08/27/2015, 09/10/2019   Moderna Sars-Covid-2 Vaccination 08/24/2020, 09/22/2020, 03/10/2021   Pneumococcal Polysaccharide-23 07/10/2021   Td 07/06/2018   Tdap 02/08/2008   Zoster Recombinat (Shingrix) 07/12/2019, 09/10/2019   Health Maintenance  Topic Date Due   COVID-19 Vaccine (4 - Booster for Moderna series) 07/26/2022 (Originally 05/05/2021)   INFLUENZA VACCINE  07/28/2022   MAMMOGRAM  12/11/2023   Fecal DNA (Cologuard)  09/20/2024   TETANUS/TDAP  07/06/2028   Hepatitis C Screening  Completed   HIV Screening  Completed   Zoster Vaccines- Shingrix  Completed   HPV VACCINES  Aged Out   Pap: 04/2013, had TAH 06/2013 - none further needed per GYN US Pelvis: 05/2013  3DMGM: 12/10/2021, gets annually at breast center DEXA: N/A  Colonoscopy: never, doing cologuard q3y  DEE: Dr. Sabra Heck, glasses, 05/2019, encouraged to schedule Dentist: [facility], last visit 2023, goes q62m  Medical History:  Past Medical History:  Diagnosis Date   Closed fracture of left wrist, with routine healing, subsequent encounter s/p ORIF 09/11/20    Depression    HSV-1 (herpes simplex virus 1) infection    PONV (postoperative nausea and vomiting)    Vitamin D deficiency   Allergies No Known Allergies  SURGICAL HISTORY She  has a past surgical history that includes Tonsillectomy and adenoidectomy; Novasure ablation; Abdominal hysterectomy (2014); Breast biopsy (Right); and ORIF wrist fracture (Left, 09/11/2020). FAMILY HISTORY Her family history includes Breast cancer in her maternal grandmother; Leukemia in her  paternal aunt. SOCIAL HISTORY She  reports that she has never smoked. She has never used smokeless tobacco. She reports that she does not drink alcohol and does not use drugs.   Review of Systems  Constitutional: Negative.  Negative for malaise/fatigue and weight loss.  HENT: Negative.  Negative for hearing loss and tinnitus.   Eyes: Negative.  Negative for blurred vision and double vision.  Respiratory: Negative.  Negative for cough, sputum production, shortness of breath and wheezing.   Cardiovascular: Negative.  Negative for chest pain, palpitations, orthopnea, claudication, leg swelling and PND.  Gastrointestinal: Negative.  Negative for abdominal pain, blood in stool, constipation, diarrhea, heartburn, melena, nausea and vomiting.  Genitourinary: Negative.   Musculoskeletal:  Negative for falls, joint pain and myalgias.  Skin: Negative.  Negative for rash.  Neurological: Negative.  Negative for dizziness, tingling, sensory change, weakness and headaches.  Endo/Heme/Allergies: Negative.  Negative for polydipsia.  Psychiatric/Behavioral: Negative.  Negative for depression, memory loss, substance abuse and suicidal ideas. The patient is not nervous/anxious and does not have insomnia.  All other systems reviewed and are negative.   Physical Exam: Estimated body mass index is 27.55 kg/m as calculated from the following:   Height as of this encounter: 5' 3.5" (1.613 m).   Weight as of this encounter: 158 lb (71.7 kg). BP 118/78   Pulse 61   Temp (!) 97.5 F (36.4 C)   Ht 5' 3.5" (1.613 m)   Wt 158 lb (71.7 kg)   LMP 05/31/2013   SpO2 99%   BMI 27.55 kg/m  General Appearance: Well nourished, in no apparent distress. Eyes: PERRLA, EOMs, conjunctiva no swelling or erythema Sinuses: No Frontal/maxillary tenderness ENT/Mouth: Ext aud canals clear, normal light reflex with TMs without erythema, bulging.  Good dentition. No erythema, swelling, or exudate on post pharynx. Tonsils not  swollen or erythematous. Hearing normal.  Neck: Supple, thyroid normal. No bruits Respiratory: Respiratory effort normal, BS equal bilaterally without rales, rhonchi, wheezing or stridor. Cardio: RRR without murmurs, rubs or gallops. Brisk peripheral pulses without edema.  Breasts: Getting annual mammograms, regular self checks, no concerns, defer Abdomen: Soft, +BS. Non tender, no guarding, rebound, hernias, masses, or organomegaly. .  Lymphatics: Non tender without lymphadenopathy.  Genitourinary: defer Musculoskeletal: Full ROM all peripheral extremities,5/5 strength, and normal gait. Skin: Tattoo on left lateral ankle. Warm, dry without rashes, concerning lesions (8 mm red/brown area to R lower leg, unchanged for several years per patient), ecchymosis. Well healed surgical scar to left wrist.  Neuro: Cranial nerves intact, reflexes equal bilaterally. Normal muscle tone, no cerebellar symptoms. Sensation intact.  Psych: Awake and oriented X 3, normal affect, Insight and Judgment appropriate.   EKG:  Sinus bradycardia, WNL, NSCPT  Julie Ribas, NP 10:37 AM [facility]

## 2022-07-10 ENCOUNTER — Encounter: Payer: Self-pay | Admitting: Adult Health

## 2022-07-10 ENCOUNTER — Ambulatory Visit (INDEPENDENT_AMBULATORY_CARE_PROVIDER_SITE_OTHER): Payer: BC Managed Care – PPO | Admitting: Adult Health

## 2022-07-10 VITALS — BP 118/78 | HR 61 | Temp 97.5°F | Ht 63.5 in | Wt 158.0 lb

## 2022-07-10 DIAGNOSIS — B009 Herpesviral infection, unspecified: Secondary | ICD-10-CM

## 2022-07-10 DIAGNOSIS — I10 Essential (primary) hypertension: Secondary | ICD-10-CM

## 2022-07-10 DIAGNOSIS — E559 Vitamin D deficiency, unspecified: Secondary | ICD-10-CM

## 2022-07-10 DIAGNOSIS — Z13220 Encounter for screening for lipoid disorders: Secondary | ICD-10-CM

## 2022-07-10 DIAGNOSIS — Z79899 Other long term (current) drug therapy: Secondary | ICD-10-CM

## 2022-07-10 DIAGNOSIS — Z0000 Encounter for general adult medical examination without abnormal findings: Secondary | ICD-10-CM | POA: Diagnosis not present

## 2022-07-10 DIAGNOSIS — E663 Overweight: Secondary | ICD-10-CM

## 2022-07-10 DIAGNOSIS — F3342 Major depressive disorder, recurrent, in full remission: Secondary | ICD-10-CM

## 2022-07-10 DIAGNOSIS — Z1329 Encounter for screening for other suspected endocrine disorder: Secondary | ICD-10-CM

## 2022-07-10 DIAGNOSIS — Z1389 Encounter for screening for other disorder: Secondary | ICD-10-CM | POA: Diagnosis not present

## 2022-07-10 DIAGNOSIS — Z136 Encounter for screening for cardiovascular disorders: Secondary | ICD-10-CM | POA: Diagnosis not present

## 2022-07-10 DIAGNOSIS — Z0001 Encounter for general adult medical examination with abnormal findings: Secondary | ICD-10-CM

## 2022-07-10 NOTE — Patient Instructions (Addendum)
  Julie Cochran , Thank you for taking time to come for your Annual Wellness Visit. I appreciate your ongoing commitment to your health goals. Please review the following plan we discussed and let me know if I can assist you in the future.   These are the goals we discussed:  Goals      DIET - EAT MORE PLANTS     Aim for whole grains, beans, nuts/seeds, variety of fruits/veggies daily     DIET - INCREASE WATER INTAKE     65+ fluid ounces, avoid daily soda     Exercise 150 min/wk Moderate Activity     Try resistance/weight bearing exercises 3 days a week for muscle and bone health in menopause.      Weight (lb) < 145 lb (65.8 kg)        This is a list of the screening recommended for you and due dates:  Health Maintenance  Topic Date Due   COVID-19 Vaccine (4 - Booster for Moderna series) 05/05/2021   Flu Shot  07/28/2022   Mammogram  12/11/2023   Cologuard (Stool DNA test)  09/20/2024   Tetanus Vaccine  07/06/2028   Hepatitis C Screening: USPSTF Recommendation to screen - Ages 18-79 yo.  Completed   HIV Screening  Completed   Zoster (Shingles) Vaccine  Completed   HPV Vaccine  Aged Out     Know what a healthy weight is for you (roughly BMI <25) and aim to maintain this  Aim for 7+ servings of fruits and vegetables daily  65-80+ fluid ounces of water or unsweet tea for healthy kidneys  Limit alcohol; avoid smoking/tobacco  Limit animal fats (saturated fats/cholesterol) in diet for cholesterol and heart health - choose grass fed whenever available  Avoid highly processed foods, and foods high in saturated/trans fats  Aim for low stress - take time to unwind and care for your mental health  Aim for 150 min of moderate intensity exercise weekly for heart health, and weights 2-3 days week weekly for bone health  Aim for 7-9 hours of sleep daily    A great goal to work towards is aiming to get in a serving daily of some of the most nutritionally dense foods - G- BOMBS  daily

## 2022-07-11 LAB — CBC WITH DIFFERENTIAL/PLATELET
Absolute Monocytes: 364 cells/uL (ref 200–950)
Basophils Absolute: 50 cells/uL (ref 0–200)
Basophils Relative: 0.9 %
Eosinophils Absolute: 129 cells/uL (ref 15–500)
Eosinophils Relative: 2.3 %
HCT: 39.7 % (ref 35.0–45.0)
Hemoglobin: 12.9 g/dL (ref 11.7–15.5)
Lymphs Abs: 1602 cells/uL (ref 850–3900)
MCH: 29.8 pg (ref 27.0–33.0)
MCHC: 32.5 g/dL (ref 32.0–36.0)
MCV: 91.7 fL (ref 80.0–100.0)
MPV: 10.6 fL (ref 7.5–12.5)
Monocytes Relative: 6.5 %
Neutro Abs: 3455 cells/uL (ref 1500–7800)
Neutrophils Relative %: 61.7 %
Platelets: 263 10*3/uL (ref 140–400)
RBC: 4.33 10*6/uL (ref 3.80–5.10)
RDW: 11.8 % (ref 11.0–15.0)
Total Lymphocyte: 28.6 %
WBC: 5.6 10*3/uL (ref 3.8–10.8)

## 2022-07-11 LAB — COMPLETE METABOLIC PANEL WITH GFR
AG Ratio: 2 (calc) (ref 1.0–2.5)
ALT: 16 U/L (ref 6–29)
AST: 18 U/L (ref 10–35)
Albumin: 4.3 g/dL (ref 3.6–5.1)
Alkaline phosphatase (APISO): 65 U/L (ref 37–153)
BUN: 15 mg/dL (ref 7–25)
CO2: 30 mmol/L (ref 20–32)
Calcium: 10.2 mg/dL (ref 8.6–10.4)
Chloride: 106 mmol/L (ref 98–110)
Creat: 0.78 mg/dL (ref 0.50–1.03)
Globulin: 2.1 g/dL (calc) (ref 1.9–3.7)
Glucose, Bld: 82 mg/dL (ref 65–99)
Potassium: 4.2 mmol/L (ref 3.5–5.3)
Sodium: 142 mmol/L (ref 135–146)
Total Bilirubin: 0.6 mg/dL (ref 0.2–1.2)
Total Protein: 6.4 g/dL (ref 6.1–8.1)
eGFR: 90 mL/min/{1.73_m2} (ref >=60)

## 2022-07-11 LAB — URINALYSIS, ROUTINE W REFLEX MICROSCOPIC
Bilirubin Urine: NEGATIVE
Glucose, UA: NEGATIVE
Hgb urine dipstick: NEGATIVE
Hyaline Cast: NONE SEEN /LPF
Ketones, ur: NEGATIVE
Nitrite: POSITIVE — AB
Protein, ur: NEGATIVE
Specific Gravity, Urine: 1.016 (ref 1.001–1.035)
WBC, UA: >=60 /HPF — AB (ref 0–5)
pH: 5.5 (ref 5.0–8.0)

## 2022-07-11 LAB — VITAMIN D 25 HYDROXY (VIT D DEFICIENCY, FRACTURES): Vit D, 25-Hydroxy: 60 ng/mL (ref 30–100)

## 2022-07-11 LAB — MAGNESIUM: Magnesium: 2.2 mg/dL (ref 1.5–2.5)

## 2022-11-02 ENCOUNTER — Other Ambulatory Visit: Payer: Self-pay | Admitting: Internal Medicine

## 2022-11-02 DIAGNOSIS — Z1231 Encounter for screening mammogram for malignant neoplasm of breast: Secondary | ICD-10-CM

## 2022-12-31 ENCOUNTER — Ambulatory Visit
Admission: RE | Admit: 2022-12-31 | Discharge: 2022-12-31 | Disposition: A | Discharge status: Home / Self Care | Payer: BC Managed Care – PPO | Source: Ambulatory Visit | Attending: Internal Medicine | Admitting: Internal Medicine

## 2022-12-31 DIAGNOSIS — Z1231 Encounter for screening mammogram for malignant neoplasm of breast: Secondary | ICD-10-CM

## 2023-02-25 ENCOUNTER — Encounter: Payer: Self-pay | Admitting: Radiology

## 2023-07-13 ENCOUNTER — Encounter: Payer: Self-pay | Admitting: Nurse Practitioner

## 2023-07-13 ENCOUNTER — Ambulatory Visit (INDEPENDENT_AMBULATORY_CARE_PROVIDER_SITE_OTHER): Payer: BC Managed Care – PPO | Admitting: Nurse Practitioner

## 2023-07-13 VITALS — BP 130/82 | HR 53 | Temp 98.0°F | Ht 63.25 in | Wt 157.2 lb

## 2023-07-13 DIAGNOSIS — Z0000 Encounter for general adult medical examination without abnormal findings: Secondary | ICD-10-CM | POA: Diagnosis not present

## 2023-07-13 DIAGNOSIS — Z130 Encounter for screening for diseases of the blood and blood-forming organs and certain disorders involving the immune mechanism: Secondary | ICD-10-CM | POA: Diagnosis not present

## 2023-07-13 DIAGNOSIS — Z1389 Encounter for screening for other disorder: Secondary | ICD-10-CM

## 2023-07-13 DIAGNOSIS — E559 Vitamin D deficiency, unspecified: Secondary | ICD-10-CM

## 2023-07-13 DIAGNOSIS — Z136 Encounter for screening for cardiovascular disorders: Secondary | ICD-10-CM | POA: Diagnosis not present

## 2023-07-13 DIAGNOSIS — Z131 Encounter for screening for diabetes mellitus: Secondary | ICD-10-CM | POA: Diagnosis not present

## 2023-07-13 DIAGNOSIS — E663 Overweight: Secondary | ICD-10-CM

## 2023-07-13 DIAGNOSIS — I10 Essential (primary) hypertension: Secondary | ICD-10-CM

## 2023-07-13 DIAGNOSIS — R9230 Dense breasts, unspecified: Secondary | ICD-10-CM

## 2023-07-13 DIAGNOSIS — Z0001 Encounter for general adult medical examination with abnormal findings: Secondary | ICD-10-CM

## 2023-07-13 DIAGNOSIS — B009 Herpesviral infection, unspecified: Secondary | ICD-10-CM

## 2023-07-13 DIAGNOSIS — Z13220 Encounter for screening for lipoid disorders: Secondary | ICD-10-CM | POA: Diagnosis not present

## 2023-07-13 DIAGNOSIS — Z79899 Other long term (current) drug therapy: Secondary | ICD-10-CM

## 2023-07-13 DIAGNOSIS — E538 Deficiency of other specified B group vitamins: Secondary | ICD-10-CM

## 2023-07-13 DIAGNOSIS — F3342 Major depressive disorder, recurrent, in full remission: Secondary | ICD-10-CM

## 2023-07-13 NOTE — Progress Notes (Signed)
Complete Physical  Assessment and Plan:  Routine general medical examination at a health care facility Due annually  Health maintenance reviewed Healthily lifestyle goals set  HSV-1 and HSV 2  infection Controlled Valtrex PRN   Depression Controlled and in remission.   Vitamin D deficiency Discussed daily supplement for goal of 60-100 Monitor Vitamin D levels  Dense breast tissue/fibroadenoma UTD on yearly mammogram 12/2022 Plan to re-screen 1 year.  Screening diabetes Education: Reviewed 'ABCs' of diabetes management  Discussed goals to be met and/or maintained include A1C (<7) Blood pressure (<130/80) Cholesterol (LDL <70) Continue Eye Exam yearly  Continue Dental Exam Q6 mo Discussed dietary recommendations Discussed Physical Activity recommendations Check A1C and insulin  Screening cholesterol  Discussed lifestyle modifications. Recommended diet heavy in fruits and veggies, omega 3's. Decrease consumption of animal meats, cheeses, and dairy products. Remain active and exercise as tolerated. Continue to monitor. Defer TSH this year as normal last several years. Check lipids  Screening cardiovascular condition EKG  Screening for hematuria or proteinuria UA/Microalbumin  Medication management All medications discussed and reviewed in full. All questions and concerns regarding medications addressed.    BMI 25.0-29.9 Discussed appropriate BMI Diet modification. Physical activity. Encouraged/praised to build confidence.  Orders Placed This Encounter  Procedures   CBC with Differential/Platelet   COMPLETE METABOLIC PANEL WITH GFR   Lipid panel   Hemoglobin A1C w/out eAG   Insulin, random   VITAMIN D 25 Hydroxy (Vit-D Deficiency, Fractures)   Urinalysis, Routine w reflex microscopic   Microalbumin / creatinine urine ratio   Vitamin B12   EKG 12-Lead    Notify office for further evaluation and treatment, questions or concerns if any reported s/s  fail to improve.   The patient was advised to call back or seek an in-person evaluation if any symptoms worsen or if the condition fails to improve as anticipated.   Further disposition pending results of labs. Discussed med's effects and SE's.    I discussed the assessment and treatment plan with the patient. The patient was provided an opportunity to ask questions and all were answered. The patient agreed with the plan and demonstrated an understanding of the instructions.  Discussed med's effects and SE's. Screening labs and tests as requested with regular follow-up as recommended.  I provided 35 minutes of face-to-face time during this encounter including counseling, chart review, and critical decision making was preformed.  Today's Plan of Care is based on a patient-centered health care approach known as shared decision making - the decisions, tests and treatments allow for patient preferences and values to be balanced with clinical evidence.    Future Appointments  Date Time Provider Department Center  07/12/2024 10:00 AM Adela Glimpse, NP [facility] None     HPI 57 y.o. DAA female  presents for a complete physical. She has HSV-1 (herpes simplex virus 1) infection; Depression; Vitamin D deficiency; Medication management; Dense breast tissue; and Overweight (BMI 25.0-29.9) on their problem list.   Overall she reports feeling well today.  She has no new concerns at this time.  Divorced, single and happy. 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   She  had sole custody of her granddaughter Franky Macho for 10 years.  Last year her father recently came back into her life and now has custody.  The adjustment was hard for Krisha the first few months but she has since started to enjoy her new role and grandmother and not guardian.  She is  enjoying single life traveling and spending time with friends.  She recently got to see her granddaughter.   Overall it  continues to be a positive transition.    S/p hysterectomy, no longer following with GYN. She gets annual 3D mammograms via breast center.Hx of dense breasts. UTD  Has history of HSV1 and HSV2, has 2-3/year, takes valtrex as needed.   BMI is Body mass index is 27.63 kg/m., she reports has very active lifestyle, managed well with diet and activity.  No longer drinking soda.   Wt Readings from Last 3 Encounters:  07/13/23 157 lb 3.2 oz (71.3 kg)  07/10/22 158 lb (71.7 kg)  07/10/21 157 lb 6.4 oz (71.4 kg)   Her blood pressure has been controlled at home, today their BP is BP: 130/82 She does workout. She denies chest pain, shortness of breath, dizziness.   She is not on cholesterol medication and denies myalgias. Her cholesterol is at goal. The cholesterol last visit was:   Lab Results  Component Value Date   CHOL 163 07/10/2021   HDL 67 07/10/2021   LDLCALC 81 07/10/2021   TRIG 73 07/10/2021   CHOLHDL 2.4 07/10/2021   Last W0J in the office was:  Lab Results  Component Value Date   HGBA1C 5.3 07/10/2021   Lab Results  Component Value Date   EGFR 90 07/10/2022   Patient unsure if currently on Vitamin D supplement.  Last Vitamin D level: Lab Results  Component Value Date   VD25OH 60 07/10/2022      Current Medications:  Current Outpatient Medications on File Prior to Visit  Medication Sig   Biotin 1000 MCG tablet Take 1,000 mcg by mouth daily.    CHOLECALCIFEROL PO Take 10,000 Units by mouth daily.   Garcinia Cambogia-Chromium 500-200 MG-MCG TABS Take 1 tablet by mouth daily in the afternoon.   Lysine 500 MG CAPS Take 500 mg by mouth daily.    Multiple Vitamin (MULTIVITAMIN) tablet Take 1 tablet by mouth daily. Woman's   valACYclovir (VALTREX) 1000 MG tablet Take 1 tab twice daily for 3-5 days as needed for cold sore breakout.   ibuprofen (ADVIL) 800 MG tablet Take 1 tablet (800 mg total) by mouth every 8 (eight) hours as needed. (Patient not taking: Reported on  07/13/2023)   No current facility-administered medications on file prior to visit.    Health Maintenance:   Immunization History  Administered Date(s) Administered   Influenza Split 10/18/2018   Influenza,inj,Quad PF,6+ Mos 09/10/2019   Influenza-Unspecified 10/18/2013, 10/17/2014, 08/27/2015, 09/10/2019   Moderna Sars-Covid-2 Vaccination 08/24/2020, 09/22/2020, 03/10/2021   Pneumococcal Polysaccharide-23 07/10/2021   Td 07/06/2018   Tdap 02/08/2008   Zoster Recombinant(Shingrix) 07/12/2019, 09/10/2019   Health Maintenance  Topic Date Due   COVID-19 Vaccine (4 - 2023-24 season) 08/28/2022   INFLUENZA VACCINE  07/29/2023   Fecal DNA (Cologuard)  09/20/2024   MAMMOGRAM  12/31/2024   DTaP/Tdap/Td (3 - Td or Tdap) 07/06/2028   Hepatitis C Screening  Completed   HIV Screening  Completed   Zoster Vaccines- Shingrix  Completed   HPV VACCINES  Aged Out   Pap: 04/2013, had TAH 06/2013 - none further needed per GYN US Pelvis: 05/2013  3DMGM: 12/2022 DEXA: N/A  Colonoscopy: Cologard 08/2021 Negative Due 2026  DEE: [facility] , glasses, 05/2019, encouraged to schedule Dentist: [facility], last visit 2023, goes q37m   Medical History:  Past Medical History:  Diagnosis Date   Closed fracture of left wrist,  with routine healing, subsequent encounter s/p ORIF 09/11/20    Depression    HSV-1 (herpes simplex virus 1) infection    PONV (postoperative nausea and vomiting)    Vitamin D deficiency   Allergies No Known Allergies  SURGICAL HISTORY She  has a past surgical history that includes Tonsillectomy and adenoidectomy; Novasure ablation; Abdominal hysterectomy (2014); Breast biopsy (Right); and ORIF wrist fracture (Left, 09/11/2020). FAMILY HISTORY Her family history includes Breast cancer in her maternal grandmother; Leukemia in her paternal aunt. SOCIAL HISTORY She  reports that she has never smoked. She has never used smokeless tobacco. She reports that she does  not drink alcohol and does not use drugs.   Review of Systems  Constitutional: Negative.  Negative for malaise/fatigue and weight loss.  HENT: Negative.  Negative for hearing loss and tinnitus.   Eyes: Negative.  Negative for blurred vision and double vision.  Respiratory: Negative.  Negative for cough, sputum production, shortness of breath and wheezing.   Cardiovascular: Negative.  Negative for chest pain, palpitations, orthopnea, claudication, leg swelling and PND.  Gastrointestinal: Negative.  Negative for abdominal pain, blood in stool, constipation, diarrhea, heartburn, melena, nausea and vomiting.  Genitourinary: Negative.   Musculoskeletal:  Negative for falls, joint pain and myalgias.  Skin: Negative.  Negative for rash.  Neurological: Negative.  Negative for dizziness, tingling, sensory change, weakness and headaches.  Endo/Heme/Allergies: Negative.  Negative for polydipsia.  Psychiatric/Behavioral: Negative.  Negative for depression, memory loss, substance abuse and suicidal ideas. The patient is not nervous/anxious and does not have insomnia.   All other systems reviewed and are negative.   Physical Exam: Estimated body mass index is 27.63 kg/m as calculated from the following:   Height as of this encounter: 5' 3.25" (1.607 m).   Weight as of this encounter: 157 lb 3.2 oz (71.3 kg). BP 130/82   Pulse (!) 53   Temp 98 F (36.7 C)   Ht 5' 3.25" (1.607 m)   Wt 157 lb 3.2 oz (71.3 kg)   LMP 05/31/2013   SpO2 99%   BMI 27.63 kg/m  General Appearance: Well nourished, in no apparent distress. Eyes: Bifocals. PERRLA, EOMs, conjunctiva no swelling or erythema Sinuses: No Frontal/maxillary tenderness ENT/Mouth: Ext aud canals clear, normal light reflex with TMs without erythema, bulging.  Good dentition. No erythema, swelling, or exudate on post pharynx. Tonsils not swollen or erythematous. Hearing normal.  Neck: Supple, thyroid normal. No bruits Respiratory: Respiratory  effort normal, BS equal bilaterally without rales, rhonchi, wheezing or stridor. Cardio: RRR without murmurs, rubs or gallops. Brisk peripheral pulses without edema.  Breasts: Getting annual mammograms, regular self checks, no concerns, defer Abdomen: Soft, +BS. Non tender, no guarding, rebound, hernias, masses, or organomegaly. .  Lymphatics: Non tender without lymphadenopathy.  Genitourinary: defer Musculoskeletal: Full ROM all peripheral extremities,5/5 strength, and normal gait. Skin: Tattoo on left lateral ankle. Warm, dry without rashes, concerning lesions (8 mm red/brown area to R lower leg, unchanged for several years per patient), ecchymosis. Well healed surgical scar to left wrist.  Neuro: Cranial nerves intact, reflexes equal bilaterally. Normal muscle tone, no cerebellar symptoms. Sensation intact.  Psych: Awake and oriented X 3, normal affect, Insight and Judgment appropriate.   EKG:  Sinus bradycardia, WNL, NSCPT  Adela Glimpse, NP 10:09 AM [facility]

## 2023-07-13 NOTE — Patient Instructions (Signed)
[employer]flix: You are What you Eat   Pending Labs.Marland KitchenMarland KitchenSuggest Daily Vitamin D-3 supplement of at least 5,000 international unit.  Suggest Daily B-12 Supplement of 500 mcg.  Healthy Eating, Adult Healthy eating may help you get and keep a healthy body weight, reduce the risk of chronic disease, and live a long and productive life. It is important to follow a healthy eating pattern. Your nutritional and calorie needs should be met mainly by different nutrient-rich foods. What are tips for following this plan? Reading food labels Read labels and choose the following: Reduced or low sodium products. Juices with 100% fruit juice. Foods with low saturated fats (<3 g per serving) and high polyunsaturated and monounsaturated fats. Foods with whole grains, such as whole wheat, cracked wheat, brown rice, and wild rice. Whole grains that are fortified with folic acid. This is recommended for females who are pregnant or who want to become pregnant. Read labels and do not eat or drink the following: Foods or drinks with added sugars. These include foods that contain brown sugar, corn sweetener, corn syrup, dextrose, fructose, glucose, high-fructose corn syrup, honey, invert sugar, lactose, malt syrup, maltose, molasses, raw sugar, sucrose, trehalose, or turbinado sugar. Limit your intake of added sugars to less than 10% of your total daily calories. Do not eat more than the following amounts of added sugar per day: 6 teaspoons (25 g) for females. 9 teaspoons (38 g) for males. Foods that contain processed or refined starches and grains. Refined grain products, such as white flour, degermed cornmeal, white bread, and white rice. Shopping Choose nutrient-rich snacks, such as vegetables, whole fruits, and nuts. Avoid high-calorie and high-sugar snacks, such as potato chips, fruit snacks, and candy. Use oil-based dressings and spreads on foods instead of solid fats such as butter, margarine, sour cream, or cream  cheese. Limit pre-made sauces, mixes, and "instant" products such as flavored rice, instant noodles, and ready-made pasta. Try more plant-protein sources, such as tofu, tempeh, black beans, edamame, lentils, nuts, and seeds. Explore eating plans such as the Mediterranean diet or vegetarian diet. Try heart-healthy dips made with beans and healthy fats like hummus and guacamole. Vegetables go great with these. Cooking Use oil to saut or stir-fry foods instead of solid fats such as butter, margarine, or lard. Try baking, boiling, grilling, or broiling instead of frying. Remove the fatty part of meats before cooking. Steam vegetables in water or broth. Meal planning  At meals, imagine dividing your plate into fourths: One-half of your plate is fruits and vegetables. One-fourth of your plate is whole grains. One-fourth of your plate is protein, especially lean meats, poultry, eggs, tofu, beans, or nuts. Include low-fat dairy as part of your daily diet. Lifestyle Choose healthy options in all settings, including home, work, school, restaurants, or stores. Prepare your food safely: Wash your hands after handling raw meats. Where you prepare food, keep surfaces clean by regularly washing with hot, soapy water. Keep raw meats separate from ready-to-eat foods, such as fruits and vegetables. Cook seafood, meat, poultry, and eggs to the recommended temperature. Get a food thermometer. Store foods at safe temperatures. In general: Keep cold foods at 16F (4.4C) or below. Keep hot foods at 116F (60C) or above. Keep your freezer at [facility] (-17.8C) or below. Foods are not safe to eat if they have been between the temperatures of 40-116F (4.4-60C) for more than 2 hours. What foods should I eat? Fruits Aim to eat 1-2 cups of fresh, canned (in natural juice), or frozen  fruits each day. One cup of fruit equals 1 small apple, 1 large banana, 8 large strawberries, 1 cup (237 g) canned fruit,  cup (82  g) dried fruit, or 1 cup (240 mL) 100% juice. Vegetables Aim to eat 2-4 cups of fresh and frozen vegetables each day, including different varieties and colors. One cup of vegetables equals 1 cup (91 g) broccoli or cauliflower florets, 2 medium carrots, 2 cups (150 g) raw, leafy greens, 1 large tomato, 1 large bell pepper, 1 large sweet potato, or 1 medium white potato. Grains Aim to eat 5-10 ounce-equivalents of whole grains each day. Examples of 1 ounce-equivalent of grains include 1 slice of bread, 1 cup (40 g) ready-to-eat cereal, 3 cups (24 g) popcorn, or  cup (93 g) cooked rice. Meats and other proteins Try to eat 5-7 ounce-equivalents of protein each day. Examples of 1 ounce-equivalent of protein include 1 egg,  oz nuts (12 almonds, 24 pistachios, or 7 walnut halves), 1/4 cup (90 g) cooked beans, 6 tablespoons (90 g) hummus or 1 tablespoon (16 g) peanut butter. A cut of meat or fish that is the size of a deck of cards is about 3-4 ounce-equivalents (85 g). Of the protein you eat each week, try to have at least 8 sounce (227 g) of seafood. This is about 2 servings per week. This includes salmon, trout, herring, sardines, and anchovies. Dairy Aim to eat 3 cup-equivalents of fat-free or low-fat dairy each day. Examples of 1 cup-equivalent of dairy include 1 cup (240 mL) milk, 8 ounces (250 g) yogurt, 1 ounces (44 g) natural cheese, or 1 cup (240 mL) fortified soy milk. Fats and oils Aim for about 5 teaspoons (21 g) of fats and oils per day. Choose monounsaturated fats, such as canola and olive oils, mayonnaise made with olive oil or avocado oil, avocados, peanut butter, and most nuts, or polyunsaturated fats, such as sunflower, corn, and soybean oils, walnuts, pine nuts, sesame seeds, sunflower seeds, and flaxseed. Beverages Aim for 6 eight-ounce glasses of water per day. Limit coffee to 3-5 eight-ounce cups per day. Limit caffeinated beverages that have added calories, such as soda and energy  drinks. If you drink alcohol: Limit how much you have to: 0-1 drink a day if you are female. 0-2 drinks a day if you are female. Know how much alcohol is in your drink. In the [place] one drink is one 12 oz bottle of beer (355 mL), one 5 oz glass of wine (148 mL), or one 1 oz glass of hard liquor (44 mL). Seasoning and other foods Try not to add too much salt to your food. Try using herbs and spices instead of salt. Try not to add sugar to food. This information is based on [place] nutrition guidelines. To learn more, visit DisposableNylon.be. Exact amounts may vary. You may need different amounts. This information is not intended to replace advice given to you by your health care provider. Make sure you discuss any questions you have with your health care provider. Document Revised: 09/14/2022 Document Reviewed: 09/14/2022 Elsevier Patient Education  2024 [employer].

## 2023-07-14 LAB — COMPLETE METABOLIC PANEL WITH GFR
AG Ratio: 2.1 (calc) (ref 1.0–2.5)
ALT: 16 U/L (ref 6–29)
AST: 18 U/L (ref 10–35)
Albumin: 4.7 g/dL (ref 3.6–5.1)
Alkaline phosphatase (APISO): 67 U/L (ref 37–153)
BUN: 19 mg/dL (ref 7–25)
CO2: 28 mmol/L (ref 20–32)
Calcium: 10.1 mg/dL (ref 8.6–10.4)
Chloride: 106 mmol/L (ref 98–110)
Creat: 0.75 mg/dL (ref 0.50–1.03)
Globulin: 2.2 g/dL (calc) (ref 1.9–3.7)
Glucose, Bld: 81 mg/dL (ref 65–99)
Potassium: 4.2 mmol/L (ref 3.5–5.3)
Sodium: 141 mmol/L (ref 135–146)
Total Bilirubin: 0.8 mg/dL (ref 0.2–1.2)
Total Protein: 6.9 g/dL (ref 6.1–8.1)
eGFR: 94 mL/min/{1.73_m2} (ref >=60)

## 2023-07-14 LAB — CBC WITH DIFFERENTIAL/PLATELET
Absolute Monocytes: 376 cells/uL (ref 200–950)
Basophils Absolute: 57 cells/uL (ref 0–200)
Basophils Relative: 1 %
Eosinophils Absolute: 57 cells/uL (ref 15–500)
Eosinophils Relative: 1 %
HCT: 40.4 % (ref 35.0–45.0)
Hemoglobin: 13.4 g/dL (ref 11.7–15.5)
Lymphs Abs: 1801 cells/uL (ref 850–3900)
MCH: 29.9 pg (ref 27.0–33.0)
MCHC: 33.2 g/dL (ref 32.0–36.0)
MCV: 90.2 fL (ref 80.0–100.0)
MPV: 10.5 fL (ref 7.5–12.5)
Monocytes Relative: 6.6 %
Neutro Abs: 3409 cells/uL (ref 1500–7800)
Neutrophils Relative %: 59.8 %
Platelets: 254 10*3/uL (ref 140–400)
RBC: 4.48 10*6/uL (ref 3.80–5.10)
RDW: 12.7 % (ref 11.0–15.0)
Total Lymphocyte: 31.6 %
WBC: 5.7 10*3/uL (ref 3.8–10.8)

## 2023-07-14 LAB — URINALYSIS, ROUTINE W REFLEX MICROSCOPIC
Bilirubin Urine: NEGATIVE
Glucose, UA: NEGATIVE
Hgb urine dipstick: NEGATIVE
Hyaline Cast: NONE SEEN /LPF
Nitrite: NEGATIVE
Protein, ur: NEGATIVE
RBC / HPF: NONE SEEN /HPF (ref 0–2)
Specific Gravity, Urine: 1.019 (ref 1.001–1.035)
pH: <=5 (ref 5.0–8.0)

## 2023-07-14 LAB — VITAMIN B12: Vitamin B-12: 681 pg/mL (ref 200–1100)

## 2023-07-14 LAB — LIPID PANEL
Cholesterol: 149 mg/dL (ref <200)
HDL: 65 mg/dL (ref >=50)
LDL Cholesterol (Calc): 70 mg/dL (calc)
Non-HDL Cholesterol (Calc): 84 mg/dL (calc) (ref <130)
Total CHOL/HDL Ratio: 2.3 (calc) (ref <5.0)
Triglycerides: 64 mg/dL (ref <150)

## 2023-07-14 LAB — MICROALBUMIN / CREATININE URINE RATIO
Creatinine, Urine: 108 mg/dL (ref 20–275)
Microalb Creat Ratio: 4 mg/g creat (ref <30)
Microalb, Ur: 0.4 mg/dL

## 2023-07-14 LAB — VITAMIN D 25 HYDROXY (VIT D DEFICIENCY, FRACTURES): Vit D, 25-Hydroxy: 65 ng/mL (ref 30–100)

## 2023-07-14 LAB — HEMOGLOBIN A1C W/OUT EAG: Hgb A1c MFr Bld: 5.3 % of total Hgb (ref <5.7)

## 2023-07-14 LAB — INSULIN, RANDOM: Insulin: 4.7 u[IU]/mL

## 2023-07-14 LAB — MICROSCOPIC MESSAGE

## 2023-07-23 ENCOUNTER — Encounter: Payer: Self-pay | Admitting: Nurse Practitioner

## 2023-07-26 ENCOUNTER — Other Ambulatory Visit: Payer: Self-pay

## 2023-07-26 MED ORDER — CYANOCOBALAMIN 250 MCG PO TABS: 250.0000 ug | ORAL_TABLET | Freq: Every day | ORAL | Status: AC

## 2023-08-11 ENCOUNTER — Other Ambulatory Visit: Payer: Self-pay | Admitting: Internal Medicine

## 2023-08-11 DIAGNOSIS — Z1231 Encounter for screening mammogram for malignant neoplasm of breast: Secondary | ICD-10-CM

## 2023-11-19 ENCOUNTER — Telehealth: Payer: Self-pay | Admitting: Nurse Practitioner

## 2023-11-19 DIAGNOSIS — B009 Herpesviral infection, unspecified: Secondary | ICD-10-CM

## 2023-11-19 MED ORDER — VALACYCLOVIR HCL 1 G PO TABS
ORAL_TABLET | ORAL | 1 refills | Status: AC
Start: 2023-11-19 — End: ?

## 2023-11-19 NOTE — Telephone Encounter (Signed)
Refill on Valtrex. Please send to [id number] - [place], [place] - [address]

## 2023-11-19 NOTE — Addendum Note (Signed)
Addended by: Adela Glimpse on: 11/19/2023 11:33 AM   Modules accepted: Orders

## 2024-01-03 ENCOUNTER — Ambulatory Visit
Admission: RE | Admit: 2024-01-03 | Discharge: 2024-01-03 | Disposition: A | Discharge status: Home / Self Care | Payer: BC Managed Care – PPO | Source: Ambulatory Visit | Attending: Internal Medicine | Admitting: Internal Medicine

## 2024-01-03 DIAGNOSIS — Z1231 Encounter for screening mammogram for malignant neoplasm of breast: Secondary | ICD-10-CM

## 2024-07-12 ENCOUNTER — Encounter: Payer: BC Managed Care – PPO | Admitting: Nurse Practitioner

## 2024-07-14 ENCOUNTER — Encounter: Payer: Self-pay | Admitting: Physician Assistant

## 2024-07-14 ENCOUNTER — Ambulatory Visit: Payer: BC Managed Care – PPO | Admitting: Physician Assistant

## 2024-07-14 VITALS — BP 120/82 | HR 72 | Temp 97.2°F | Ht 63.25 in | Wt 162.0 lb

## 2024-07-14 DIAGNOSIS — Z13220 Encounter for screening for lipoid disorders: Secondary | ICD-10-CM

## 2024-07-14 DIAGNOSIS — Z0000 Encounter for general adult medical examination without abnormal findings: Secondary | ICD-10-CM

## 2024-07-14 DIAGNOSIS — Z0001 Encounter for general adult medical examination with abnormal findings: Secondary | ICD-10-CM

## 2024-07-14 DIAGNOSIS — Z7689 Persons encountering health services in other specified circumstances: Secondary | ICD-10-CM

## 2024-07-14 NOTE — Progress Notes (Signed)
 Complete physical exam  Patient: Julie Cochran   DOB: [birth year]   58 y.o. Female  MRN: [id number]  Subjective:    Chief Complaint  Patient presents with   New Patient (Initial Visit)    New patient physical     [name] is a 58 y.o. female who presents today for a complete physical exam. She reports consuming a general diet. Patient walks for exercise 1-2 times weekly She generally feels well. She reports sleeping well. She does not have additional problems to discuss today.    Most recent fall risk assessment:    07/14/2024    1:21 PM  Fall Risk   Falls in the past year? 0     Most recent depression screenings:    07/14/2024    1:21 PM 07/10/2022    9:55 AM  PHQ 2/9 Scores  PHQ - 2 Score 0 0  PHQ- 9 Score 0     Vision:Within last year and Dental: No current dental problems and Receives regular dental care  Patient Care Team: Tonita Fallow, MD as PCP - General (Internal Medicine) Horacio Boas, MD as Consulting Physician (Obstetrics and Gynecology) Roz Anes, MD as Consulting Physician (Ophthalmology)   Outpatient Medications Prior to Visit  Medication Sig   Biotin 1000 MCG tablet Take 1,000 mcg by mouth daily.    CHOLECALCIFEROL PO Take 10,000 Units by mouth daily.   cyanocobalamin  (VITAMIN B12) 250 MCG tablet Take 1 tablet (250 mcg total) by mouth daily.   Garcinia Cambogia-Chromium 500-200 MG-MCG TABS Take 1 tablet by mouth daily in the afternoon.   Lysine 500 MG CAPS Take 500 mg by mouth daily.    Multiple Vitamin (MULTIVITAMIN) tablet Take 1 tablet by mouth daily. Woman's   valACYclovir  (VALTREX ) 1000 MG tablet Take 1 tab twice daily for 3-5 days as needed for cold sore breakout.   [DISCONTINUED] ibuprofen  (ADVIL ) 800 MG tablet Take 1 tablet (800 mg total) by mouth every 8 (eight) hours as needed. (Patient not taking: Reported on 07/13/2023)   No facility-administered medications prior to visit.    Review of Systems  Constitutional:   Negative for chills, fever and malaise/fatigue.  Eyes:  Negative for blurred vision and double vision.  Respiratory:  Negative for cough and shortness of breath.   Cardiovascular:  Negative for chest pain and palpitations.  Musculoskeletal:  Negative for joint pain and myalgias.  Neurological:  Negative for dizziness and headaches.  Psychiatric/Behavioral:  Negative for depression. The patient is not nervous/anxious.           Objective:     BP 120/82   Pulse 72   Temp (!) 97.2 F (36.2 C)   Ht 5' 3.25 (1.607 m)   Wt 162 lb (73.5 kg)   LMP 05/31/2013   SpO2 98%   BMI 28.47 kg/m   Physical Exam Constitutional:      Appearance: Normal appearance.  HENT:     Head: Normocephalic.     Mouth/Throat:     Mouth: Mucous membranes are moist.     Pharynx: Oropharynx is clear.  Eyes:     Extraocular Movements: Extraocular movements intact.     Conjunctiva/sclera: Conjunctivae normal.  Cardiovascular:     Rate and Rhythm: Normal rate and regular rhythm.     Heart sounds: Normal heart sounds. No murmur heard. Pulmonary:     Effort: Pulmonary effort is normal.     Breath sounds: No wheezing, rhonchi or rales.  Skin:  General: Skin is warm and dry.  Neurological:     General: No focal deficit present.     Mental Status: She is alert and oriented to person, place, and time.  Psychiatric:        Mood and Affect: Mood normal.        Behavior: Behavior normal.      No results found for any visits on 07/14/24.    Assessment & Plan:    Routine Health Maintenance and Physical Exam  Health Maintenance  Topic Date Due   COVID-19 Vaccine (4 - 2024-25 season) 07/30/2024*   Hepatitis B Vaccine (1 of 3 - 19+ 3-dose series) 07/14/2025*   Flu Shot  07/28/2024   Cologuard (Stool DNA test)  09/20/2024   Mammogram  01/02/2026   DTaP/Tdap/Td vaccine (3 - Td or Tdap) 07/06/2028   Hepatitis C Screening  Completed   HIV Screening  Completed   Zoster (Shingles) Vaccine  Completed    Pneumococcal Vaccination  Aged Out   HPV Vaccine  Aged Out   Meningitis B Vaccine  Aged Out  *Topic was postponed. The date shown is not the original due date.    Discussed health benefits of physical activity, and encouraged her to engage in regular exercise appropriate for her age and condition.  Problem List Items Addressed This Visit   None Visit Diagnoses       Encounter to establish care    -  Primary     Annual visit for general adult medical examination without abnormal findings       Relevant Orders   CMP14+EGFR   CBC with Differential/Platelet     Screening for lipid disorders       Relevant Orders   Lipid panel      Return in about 1 year (around 07/14/2025).  Adult wellness-complete.wellness physical was conducted today. Importance of diet and exercise were discussed in detail.  Importance of stress reduction and healthy living were discussed.  In addition to this a discussion regarding safety was also covered.  We also reviewed over immunizations and gave recommendations regarding current immunization needed for age.   In addition to this additional areas were also touched on including: exercise goal of 150 minutes per week Preventative health exams needed: none, patient up to date on all health maintenance   Colonoscopy- Cologuard due in September of this year, will place order to be mailed later this year  Patient was advised yearly wellness exam     Charmaine Odessa Morren, PA-C

## 2024-07-17 ENCOUNTER — Ambulatory Visit: Payer: Self-pay | Admitting: Physician Assistant

## 2024-07-18 LAB — CBC WITH DIFFERENTIAL/PLATELET
Basophils Absolute: 0.1 x10E3/uL (ref 0.0–0.2)
Basos: 1 %
EOS (ABSOLUTE): 0.1 x10E3/uL (ref 0.0–0.4)
Eos: 2 %
Hematocrit: 44.1 % (ref 34.0–46.6)
Hemoglobin: 14.3 g/dL (ref 11.1–15.9)
Immature Grans (Abs): 0 x10E3/uL (ref 0.0–0.1)
Immature Granulocytes: 0 %
Lymphocytes Absolute: 2.4 x10E3/uL (ref 0.7–3.1)
Lymphs: 36 %
MCH: 29.7 pg (ref 26.6–33.0)
MCHC: 32.4 g/dL (ref 31.5–35.7)
MCV: 92 fL (ref 79–97)
Monocytes Absolute: 0.5 x10E3/uL (ref 0.1–0.9)
Monocytes: 8 %
Neutrophils Absolute: 3.5 x10E3/uL (ref 1.4–7.0)
Neutrophils: 53 %
Platelets: 271 x10E3/uL (ref 150–450)
RBC: 4.82 x10E6/uL (ref 3.77–5.28)
RDW: 12 % (ref 11.7–15.4)
WBC: 6.6 x10E3/uL (ref 3.4–10.8)

## 2024-07-18 LAB — CMP14+EGFR
ALT: 17 IU/L (ref 0–32)
AST: 22 IU/L (ref 0–40)
Albumin: 4.6 g/dL (ref 3.8–4.9)
Alkaline Phosphatase: 85 IU/L (ref 44–121)
BUN/Creatinine Ratio: 16 (ref 9–23)
BUN: 16 mg/dL (ref 6–24)
Bilirubin Total: 0.3 mg/dL (ref 0.0–1.2)
CO2: 25 mmol/L (ref 20–29)
Calcium: 10.1 mg/dL (ref 8.7–10.2)
Chloride: 102 mmol/L (ref 96–106)
Creatinine, Ser: 0.98 mg/dL (ref 0.57–1.00)
Globulin, Total: 2.2 g/dL (ref 1.5–4.5)
Glucose: 95 mg/dL (ref 70–99)
Potassium: 4.5 mmol/L (ref 3.5–5.2)
Sodium: 141 mmol/L (ref 134–144)
Total Protein: 6.8 g/dL (ref 6.0–8.5)
eGFR: 68 mL/min/1.73 (ref >59)

## 2024-07-18 LAB — LIPID PANEL
Chol/HDL Ratio: 2.5 ratio (ref 0.0–4.4)
Cholesterol, Total: 166 mg/dL (ref 100–199)
HDL: 67 mg/dL (ref >39)
LDL Chol Calc (NIH): 76 mg/dL (ref 0–99)
Triglycerides: 134 mg/dL (ref 0–149)
VLDL Cholesterol Cal: 23 mg/dL (ref 5–40)

## 2024-09-06 ENCOUNTER — Other Ambulatory Visit: Payer: Self-pay | Admitting: Physician Assistant

## 2024-09-06 DIAGNOSIS — Z1211 Encounter for screening for malignant neoplasm of colon: Secondary | ICD-10-CM

## 2024-10-08 DIAGNOSIS — Z1211 Encounter for screening for malignant neoplasm of colon: Secondary | ICD-10-CM | POA: Diagnosis not present

## 2024-10-13 LAB — COLOGUARD: COLOGUARD: NEGATIVE

## 2024-10-16 ENCOUNTER — Ambulatory Visit: Payer: Self-pay | Admitting: Physician Assistant

## 2024-11-28 ENCOUNTER — Other Ambulatory Visit: Payer: Self-pay | Admitting: Physician Assistant

## 2024-11-28 DIAGNOSIS — Z1231 Encounter for screening mammogram for malignant neoplasm of breast: Secondary | ICD-10-CM

## 2025-01-03 ENCOUNTER — Ambulatory Visit
Admission: RE | Admit: 2025-01-03 | Discharge: 2025-01-03 | Disposition: A | Discharge status: Home / Self Care | Source: Ambulatory Visit | Attending: Physician Assistant | Admitting: Physician Assistant

## 2025-01-03 DIAGNOSIS — Z1231 Encounter for screening mammogram for malignant neoplasm of breast: Secondary | ICD-10-CM

## 2025-01-07 ENCOUNTER — Ambulatory Visit: Payer: Self-pay | Admitting: Physician Assistant

## 2025-07-16 ENCOUNTER — Encounter: Admitting: Physician Assistant
